# Patient Record
Sex: Female | Born: 1975 | Race: White | Hispanic: Yes | Marital: Single | Smoking: Never smoker
Health system: Southern US, Community
[De-identification: ages and names within clinical notes are randomized; demographics above are authoritative.]

## PROBLEM LIST (undated history)

## (undated) DIAGNOSIS — F32A Depression, unspecified: Secondary | ICD-10-CM

## (undated) DIAGNOSIS — F419 Anxiety disorder, unspecified: Secondary | ICD-10-CM

## (undated) DIAGNOSIS — I10 Essential (primary) hypertension: Secondary | ICD-10-CM

## (undated) DIAGNOSIS — F329 Major depressive disorder, single episode, unspecified: Secondary | ICD-10-CM

## (undated) DIAGNOSIS — T7840XA Allergy, unspecified, initial encounter: Secondary | ICD-10-CM

## (undated) DIAGNOSIS — K589 Irritable bowel syndrome without diarrhea: Secondary | ICD-10-CM

## (undated) DIAGNOSIS — G4733 Obstructive sleep apnea (adult) (pediatric): Secondary | ICD-10-CM

## (undated) HISTORY — DX: Depression, unspecified: F32.A

## (undated) HISTORY — DX: Obstructive sleep apnea (adult) (pediatric): G47.33

## (undated) HISTORY — DX: Irritable bowel syndrome, unspecified: K58.9

## (undated) HISTORY — DX: Allergy, unspecified, initial encounter: T78.40XA

## (undated) HISTORY — DX: Anxiety disorder, unspecified: F41.9

## (undated) HISTORY — DX: Major depressive disorder, single episode, unspecified: F32.9

## (undated) HISTORY — DX: Essential (primary) hypertension: I10

---

## 2007-03-06 ENCOUNTER — Other Ambulatory Visit: Admission: RE | Admit: 2007-03-06 | Discharge: 2007-03-06 | Payer: Self-pay | Admitting: Obstetrics and Gynecology

## 2008-03-06 ENCOUNTER — Emergency Department (HOSPITAL_COMMUNITY): Admission: EM | Admit: 2008-03-06 | Discharge: 2008-03-06 | Payer: Self-pay | Admitting: Emergency Medicine

## 2008-09-22 ENCOUNTER — Encounter: Admission: RE | Admit: 2008-09-22 | Discharge: 2008-09-22 | Payer: Self-pay | Admitting: Internal Medicine

## 2011-03-08 LAB — URINALYSIS, ROUTINE W REFLEX MICROSCOPIC
Bilirubin Urine: NEGATIVE
Glucose, UA: NEGATIVE
Hgb urine dipstick: NEGATIVE
Specific Gravity, Urine: 1.031 — ABNORMAL HIGH
Urobilinogen, UA: 1
pH: 8

## 2011-03-08 LAB — URINE MICROSCOPIC-ADD ON

## 2011-05-05 ENCOUNTER — Ambulatory Visit
Admission: RE | Admit: 2011-05-05 | Discharge: 2011-05-05 | Disposition: A | Discharge status: Home / Self Care | Payer: BC Managed Care – PPO | Source: Ambulatory Visit | Attending: Emergency Medicine | Admitting: Emergency Medicine

## 2011-05-05 ENCOUNTER — Other Ambulatory Visit: Payer: Self-pay | Admitting: Emergency Medicine

## 2011-05-05 MED ORDER — IOHEXOL 300 MG/ML  SOLN
125.0000 mL | Freq: Once | INTRAMUSCULAR | Status: AC | PRN
  Administered 2011-05-05: 125 mL via INTRAVENOUS

## 2011-05-06 ENCOUNTER — Other Ambulatory Visit: Payer: Self-pay | Admitting: Emergency Medicine

## 2011-05-06 DIAGNOSIS — N83209 Unspecified ovarian cyst, unspecified side: Secondary | ICD-10-CM

## 2011-05-09 ENCOUNTER — Ambulatory Visit
Admission: RE | Admit: 2011-05-09 | Discharge: 2011-05-09 | Disposition: A | Discharge status: Home / Self Care | Payer: BC Managed Care – PPO | Source: Ambulatory Visit | Attending: Emergency Medicine | Admitting: Emergency Medicine

## 2011-05-09 DIAGNOSIS — N83209 Unspecified ovarian cyst, unspecified side: Secondary | ICD-10-CM

## 2011-12-13 ENCOUNTER — Ambulatory Visit (INDEPENDENT_AMBULATORY_CARE_PROVIDER_SITE_OTHER): Payer: BC Managed Care – PPO | Admitting: Emergency Medicine

## 2011-12-13 VITALS — BP 128/84 | HR 80 | Temp 98.2°F | Resp 16 | Ht 69.5 in | Wt 226.2 lb

## 2011-12-13 DIAGNOSIS — L509 Urticaria, unspecified: Secondary | ICD-10-CM

## 2011-12-13 MED ORDER — METHYLPREDNISOLONE ACETATE 80 MG/ML IJ SUSP
80.0000 mg | Freq: Once | INTRAMUSCULAR | Status: AC
  Administered 2011-12-13: 80 mg via INTRAMUSCULAR

## 2011-12-13 NOTE — Progress Notes (Signed)
   Date:  12/13/2011   Name:  Judith Francis   DOB:  12/18/1975   MRN:  [id number]  PCP:  No primary provider on file.    Chief Complaint: Rash   History of Present Illness:  Judith Francis is a 36 y.o. very pleasant female patient who presents with the following:  Went to beach and was sunburned over weekend.  Used a number of topical agents that have resulted in hives.  No shortness of breath or wheezing.  Generalized urticarial eruption and itching  There is no problem list on file for this patient.  No past medical history on file. No past surgical history on file. History  Substance Use Topics  . Smoking status: Never Smoker   . Smokeless tobacco: Not on file  . Alcohol Use: No   No family history on file. No Known Allergies  Medication list has been reviewed and updated.  No current outpatient prescriptions on file prior to visit.    Review of Systems:  As per HPI, otherwise negative.    Physical Examination: Filed Vitals:   12/13/11 1545  BP: 128/84  Pulse: 80  Temp: 98.2 F (36.8 C)  Resp: 16   Filed Vitals:   12/13/11 1545  Height: 5' 9.5" (1.765 m)  Weight: 226 lb 3.2 oz (102.604 kg)   Body mass index is 32.93 kg/(m^2). Ideal Body Weight: Weight in (lb) to have BMI = 25: 171.4    GEN: WDWN, NAD, Non-toxic, Alert & Oriented x 3 HEENT: Atraumatic, Normocephalic.  Ears and Nose: No external deformity. EXTR: No clubbing/cyanosis/edema NEURO: Normal gait.  PSYCH: Normally interactive. Conversant. Not depressed or anxious appearing.  Calm demeanor.  Generalized hives  EKG / Labs / Xrays: None available at time of encounter  Assessment and Plan: Urticaria Depo medrol 80  Benadryl Return as needed  Carmelina Dane, MD

## 2011-12-15 ENCOUNTER — Ambulatory Visit (INDEPENDENT_AMBULATORY_CARE_PROVIDER_SITE_OTHER): Payer: BC Managed Care – PPO | Admitting: Family Medicine

## 2011-12-15 VITALS — BP 132/94 | HR 82 | Temp 98.1°F | Resp 18 | Ht 69.5 in | Wt 225.2 lb

## 2011-12-15 DIAGNOSIS — L509 Urticaria, unspecified: Secondary | ICD-10-CM

## 2011-12-15 MED ORDER — DIPHENHYDRAMINE HCL 50 MG/ML IJ SOLN
25.0000 mg | Freq: Once | INTRAMUSCULAR | Status: AC
  Administered 2011-12-15: 25 mg via INTRAMUSCULAR

## 2011-12-15 MED ORDER — PREDNISONE 50 MG PO TABS: ORAL_TABLET | ORAL | Status: AC

## 2011-12-15 MED ORDER — METHYLPREDNISOLONE SODIUM SUCC 1000 MG IJ SOLR
125.0000 mg | Freq: Once | INTRAMUSCULAR | Status: AC
  Administered 2011-12-15: 130 mg via INTRAMUSCULAR

## 2011-12-15 MED ORDER — FAMOTIDINE 20 MG PO TABS: 20.0000 mg | ORAL_TABLET | Freq: Two times a day (BID) | ORAL | Status: DC | PRN

## 2011-12-15 NOTE — Progress Notes (Signed)
  Subjective:    Patient ID: Judith Francis, female    DOB: Aug 15, 1975, 36 y.o.   MRN: [id number]  HPI HPI  This patient complains of a RASH  Location: generalized   Onset: 5-7days ago  Course: Pt went to beach, got mild case of sunburn, had used various lotions and creams to help, developed severe rash and itching after this.  Was seen here and given depomedrol and scheduled benadryl Pt states that itching and rash has persisted despite benadryl.  Pt states that she still has been using creams on daily basis up until today.   Self-treated with: creams, aloe vera  Improvement with treatment: no  History  Itching: yes  Tenderness: no  New medications/antibiotics: no Pet exposure: no  Recent travel or tropical exposure: yes  New soaps, shampoos, detergent, clothing: yes  Tick/insect exposure: no  Red Flags  Feeling ill: no  Fever: no  Facial/tongue swelling/difficulty breathing: no  Diabetic or immunocompromised: no      Review of Systems See HPI, otherwise ROS negative    Objective:   Physical Exam Gen: up in chair, NAD HEENT: NCAT, EOMI, TMs clear bilaterally CV: RRR, no murmurs auscultated PULM: CTAB, no wheezes, rales, rhoncii ABD: S/NT/+ bowel sounds  EXT: 2+ peripheral pulses SKIN:         Assessment & Plan:  Urticaria/Hives: Mildly worsening.  Suspect from chemical/cream exposure.  Discussed avoidance.  Solumedrol 125 and benadryl 25 mg IMx1 Prednisone burst x 5 days. Benadryl and pepcid. Follow up in 2-3 days.  Anaphylactic red flags reviewed.  Handout given.     The patient and/or caregiver has been counseled thoroughly with regard to treatment plan and/or medications prescribed including dosage, schedule, interactions, rationale for use, and possible side effects and they verbalize understanding. Diagnoses and expected course of recovery discussed and will return if not improved as expected or if the condition worsens. Patient and/or  caregiver verbalized understanding.

## 2011-12-15 NOTE — Patient Instructions (Signed)
Ronchas (Hives) Las ronchas (urticaria) son manchas rojas hinchadas en la piel, que pican. Pueden cambiar de tamao, forma, ubicacin y Physiological scientist. Las ronchas que se producen en las capas profundas de la piel pueden causar inflamacin en las manos, los pies y [place]. Las ronchas pueden ser una reaccin alrgica a algo que usted o su hijo hayan comido, tocado o hayan colocado sobre la piel. Las ronchas tambin pueden producirse como reaccin al fro, al calor, a las infecciones virales, a las drogas, a las picaduras de [place], o a las situaciones de Librarian, academic. Es frecuente que no se Oceanographer. Las ronchas pueden durar desde [address] a Psychologist, educational. No es un trastorno contagioso. INSTRUCCIONES PARA EL CUIDADO DOMICILIARIO  Si conoce la causa de las ronchas, evite exponerse a esa fuente.   Para aliviar la picazn y la erupcin:   Aplique compresas fras sobre la piel o tome baos de agua fra. No tome ni haga tomar a su hijo baos o duchas calientes porque el calor puede empeorar la picazn.   El mejor medicamento para las ronchas es el antihistamnico. El antihistamnico no curar las ronchas, Biomedical engineer las reducir. Puede utilizar antihistamnicos de [employer]. Este medicamento podr adormecer a su hijo. Los adolescentes no Doctor, hospital al [employer].   Utilice antihistamnico cada 6 horas o hasta que las ronchas hayan desaparecido completamente durante 24 horas, o segn le hayan indicado.   Podrn prescribirle otros medicamentos a su hijo para Associate Professor. Dle los medicamentos a su hijo Chief Operating Officer que lo asiste.   Usted o su hijo deben usar ropa suelta, inclusive la ropa interior. Las irritaciones de la piel pueden empeorar las ronchas.   Realice el seguimiento segn las instrucciones que le ha dado el profesional que lo asiste.  SOLICITE ATENCIN MDICA SI:  Usted o su hijo an sienten una picazn considerable  despus de [employer] (prescriptos o de Sales promotion account executive).   Existe hinchazn o dolor en las articulaciones.  SOLICITE ATENCIN MDICA DE INMEDIATO SI:  Tiene fiebre.   Nota inflamacin en los labios o en la lengua.   Existe dificultad al respirar o tragar, o siente "falta de espacio" en la garganta o en el pecho.   Siente dolor abdominal (en el vientre).   El nio acta como si estuviera enfermo.  Pueden ser los primeros signos de una reaccin alrgica que ponga en peligro la vida. ESTO ES UNA EMERGENCIA. Pida ayuda mdica al 911. EST SEGURO QUE:   Comprende las instrucciones para el alta mdica.   Controlar su enfermedad.   Solicitar atencin mdica de inmediato segn las indicaciones.  Document Released: 05/23/2005 Document Revised: 05/12/2011 [facility] Patient Information 2012 [place], [place].

## 2012-07-30 ENCOUNTER — Ambulatory Visit (INDEPENDENT_AMBULATORY_CARE_PROVIDER_SITE_OTHER): Payer: BC Managed Care – PPO | Admitting: Emergency Medicine

## 2012-07-30 VITALS — BP 145/86 | HR 71 | Temp 97.9°F | Resp 16 | Ht 69.5 in | Wt 210.0 lb

## 2012-07-30 DIAGNOSIS — G4733 Obstructive sleep apnea (adult) (pediatric): Secondary | ICD-10-CM

## 2012-07-30 MED ORDER — LORAZEPAM 1 MG PO TABS: 1.0000 mg | ORAL_TABLET | Freq: Two times a day (BID) | ORAL | Status: DC | PRN

## 2012-07-30 NOTE — Patient Instructions (Addendum)
Insomnio (Insomnia) El insomnio es un trastorno frecuente en la capacidad para dormirse o para permanecer dormido. Puede ser un problema crnico o un trastorno del momento. En ambos casos es un problema frecuente. Puede tratarse de un problema del momento cuando se relaciona con alguna situacin de estrs o preocupacin. Es un trastorno crnico cuando se relaciona con situaciones de estrs durante las horas de vigilia o con malos hbitos de sueo. Con el tiempo, la privacin del sueo en s misma, puede hacer que el problema empeore. Las cosas ms pequeas se agravan debido al cansancio y a que la capacidad para enfrentarlas disminuye. CAUSAS  Estrs, ansiedad y depresin.  Malos hbitos para dormir.  Distracciones como mirar TV en la cama.  Siestas en horarios prximos a la hora de ir a dormir.  Involucrarse en conversaciones de gran carga emocional antes de ir a dormir.  Leer textos tcnicos antes de dormir.  Consumir alcohol y otros sedantes. Ellos pueden empeorar el problema. Pueden modificar los patrones de sueo normales y la normal actividad onrica.  Consumir estimulantes como cafena algunas horas antes de ir a dormir.  Sndromes dolorosos y las dificultades respiratorias pueden causar insomnio.  Realizar ejercicios a ltima hora de la noche.  El cambio en las zonas horarias puede causar trastornos del sueo (jet lag). En algunos casos se recomienda que otra persona observe sus patrones de sueo. Deben observar los perodos en los que no respira durante la noche (apnea del sueo). Tambin deben observar cunto tiempo duran esos perodos. Si vive slo o no tiene una persona de confianza que lo observe, podr concurrir a una clnica del sueo, en la que lo controlarn de manera profesional. La apnea del sueo requiere controles y tratamiento. Entregue su historia clnica al profesional que lo asiste Tambin infrmele las observaciones que sus familiares hayan hecho con respecto a sus  hbitos de sueo.  SNTOMAS  Sentir por la [place] que no ha descansado lo suficiente.  Ansiedad y agitacin a la hora de dormir  Dificultad para dormirse o para continuar el sueo. TRATAMIENTO  El profesional le indicar un tratamiento para los trastornos subyacentes. Tambin podr aconsejarlo o ayudarlo si usted toma alcohol o se automedica con otras drogas. El tratamiento de los problemas subyacentes generalmente eliminarn los problemas de insomnio.  Podrn prescribirle medicamentos para usar durante un plazo breve. Generalmente no se recomiendan para un uso prolongado.  Generalmente no se recomiendan los medicamentos de venta libre para un uso prolongado. Podran causarle adiccin.  Puede hacer ms fcil el conciliar el sueo si realiza modificaciones en su estilo de vida tales como:  Utilizar tcnicas de relajacin que favorecen la respiracin y reducen la tensin muscular.  No practicar actividad fsica en las ltimas horas del da.  Modificar la dieta y el horario de la ltima comida. No tomar colaciones durante la noche  Trate de establecer una hora habitual para irse a dormir.  La psicoterapia puede ser de utilidad para los problemas que le ocasionan estrs y preocupaciones.  Si hay un ambiente ruidoso y no puede evitarlo, puede ser til escuchar msica suave o sonidos agradables.  Suspenda los trabajos tediosos y detallados al menos una hora antes de ir a dormir. INSTRUCCIONES PARA EL CUIDADO DOMICILIARIO  Lleve un diario. Infrmele al profesional que lo asiste sus progresos. Esto incluye todos los efectos secundarios de los medicamentos. Concurra regularmente a la consulta con el profesional [place] nota de:  La hora en que se duerme.  Las horas en las   que permanece despierto durante la noche.  La calidad del sueo.  Cmo se siente al da siguiente. Esta informacin ayudar a que Biochemist, clinical.   Levntese de la cama si permanece despierto por  ms de 15 minutos. Lea o realice alguna actividad tranquila. Mantenga las luces bajas. Espere hasta que sienta sueo y luego vuelva a la cama.  Mantenga un ritmo constante de vigilia y sueo. Evite las siestas.  Practique actividad fsica con regularidad.  Evite las distracciones en el momento de ir a dormir. Entre las distracciones se Production assistant, radio ver televisin o Education officer, environmental alguna actividad intensa o [place], hacer las cuentas de los gastos domsticos.  Programe un ritual para irse a dormir. Mantenga una rutina familiar relacionada con el bao diario, el cepillado de los [place], irse a la cama todas las noches a la misma hora, Tax adviser [place]. La rutina aumenta el xito de conciliar el sueo ms rpido.  Use tcnicas de relajacin. Practique rutinas que favorezcan la respiracin y alivien la tensin muscular. Tambin puede ayudarlo la visualizacin de escenas pacficas. Tambin puede tratar de [employer] pensamientos problemticos o molestos si mantiene la mente ocupada con pensamientos repetitivos o aburridos, como el antiguo consejo de Multimedia programmer. Tambin puede ser ms creativo, e imaginar que planta hermosas flores en su jardn, una detrs de la otra.  Durante el da, trabaje para Chief Strategy Officer. Cuando no es posible, algunas de las sugerencias ya presentadas lo ayudarn a reducir la ansiedad que acompaa las situaciones de estrs. EST SEGURO QUE:   Comprende las instrucciones para el alta mdica.  Controlar su enfermedad.  Solicitar atencin mdica de inmediato segn las indicaciones. Document Released: 05/23/2005 Document Revised: 08/15/2011 [facility] Patient Information 2013 [place], [place].

## 2012-07-30 NOTE — Progress Notes (Signed)
[facility] [address] [place] [address] [phone]  Date:  07/30/2012   Name:  Judith Francis   DOB:  [birth year]   MRN:  [id number]  PCP:  No primary provider on file.    Chief Complaint: face flushed and Insomnia   History of Present Illness:  Judith Francis is a 37 y.o. very pleasant female patient who presents with the following:  Not able to fall asleep and is fighting with her CPAP machine.  Is becoming frustrated and having emotional difficulties due to the lack of sleep over the past two weeks.  Now has an erythematous change on her cheeks at time like an exaggerated blush when she is emotional.  Is concerned that something is seriously wrong.  There is no problem list on file for this patient.   History reviewed. No pertinent past medical history.  History reviewed. No pertinent past surgical history.  History  Substance Use Topics  . Smoking status: Never Smoker   . Smokeless tobacco: Not on file  . Alcohol Use: No    No family history on file.  No Known Allergies  Medication list has been reviewed and updated.  Current Outpatient Prescriptions on File Prior to Visit  Medication Sig Dispense Refill  . DIPHENHYDRAMINE HCL, TOPICAL, (BENADRYL ITCH STOPPING) 2 % GEL Apply topically as needed.      . famotidine (PEPCID) 20 MG tablet Take 1 tablet (20 mg total) by mouth 2 (two) times daily as needed for heartburn.  30 tablet  0  . Naproxen Sodium (ALEVE PO) Take by mouth as needed.       No current facility-administered medications on file prior to visit.    Review of Systems:  As per HPI, otherwise negative.    Physical Examination: Filed Vitals:   07/30/12 1332  BP: 145/86  Pulse: 71  Temp: 97.9 F (36.6 C)  Resp: 16   Filed Vitals:   07/30/12 1332  Height: 5' 9.5" (1.765 m)  Weight: 210 lb (95.255 kg)   Body mass index is 30.58 kg/(m^2). Ideal Body Weight: Weight in (lb) to have BMI = 25:  171.4  GEN: WDWN, NAD, Non-toxic, A & O x 3 HEENT: Atraumatic, Normocephalic. Neck supple. No masses, No LAD.  Intermittent erythema of cheeks.  No rash Ears and Nose: No external deformity. CV: RRR, No M/G/R. No JVD. No thrill. No extra heart sounds. PULM: CTA B, no wheezes, crackles, rhonchi. No retractions. No resp. distress. No accessory muscle use. ABD: S, NT, ND, +BS. No rebound. No HSM. EXTR: No c/c/e NEURO Normal gait.  PSYCH: Normally interactive. Conversant. Not depressed or anxious appearing.  Calm demeanor.    Assessment and Plan:  OSA Insomnia depression  Carmelina Dane, MD

## 2012-08-24 ENCOUNTER — Ambulatory Visit (INDEPENDENT_AMBULATORY_CARE_PROVIDER_SITE_OTHER): Payer: BC Managed Care – PPO | Admitting: Emergency Medicine

## 2012-08-24 ENCOUNTER — Encounter: Payer: Self-pay | Admitting: Emergency Medicine

## 2012-08-24 ENCOUNTER — Other Ambulatory Visit: Payer: Self-pay

## 2012-08-24 ENCOUNTER — Other Ambulatory Visit: Payer: Self-pay | Admitting: Emergency Medicine

## 2012-08-24 VITALS — BP 135/88 | HR 64 | Temp 97.5°F | Resp 16 | Ht 70.0 in | Wt 208.0 lb

## 2012-08-24 DIAGNOSIS — Z0000 Encounter for general adult medical examination without abnormal findings: Secondary | ICD-10-CM

## 2012-08-24 DIAGNOSIS — G4733 Obstructive sleep apnea (adult) (pediatric): Secondary | ICD-10-CM

## 2012-08-24 DIAGNOSIS — E663 Overweight: Secondary | ICD-10-CM

## 2012-08-24 DIAGNOSIS — R109 Unspecified abdominal pain: Secondary | ICD-10-CM

## 2012-08-24 DIAGNOSIS — R21 Rash and other nonspecific skin eruption: Secondary | ICD-10-CM

## 2012-08-24 DIAGNOSIS — N63 Unspecified lump in unspecified breast: Secondary | ICD-10-CM

## 2012-08-24 LAB — COMPREHENSIVE METABOLIC PANEL
AST: 26 U/L (ref 0–37)
Albumin: 4.1 g/dL (ref 3.5–5.2)
Alkaline Phosphatase: 79 U/L (ref 39–117)
Calcium: 8.8 mg/dL (ref 8.4–10.5)
Chloride: 105 mEq/L (ref 96–112)
Glucose, Bld: 87 mg/dL (ref 70–99)
Potassium: 4.1 mEq/L (ref 3.5–5.3)
Sodium: 137 mEq/L (ref 135–145)
Total Protein: 6.8 g/dL (ref 6.0–8.3)

## 2012-08-24 LAB — POCT UA - MICROSCOPIC ONLY
Bacteria, U Microscopic: NEGATIVE
Yeast, UA: NEGATIVE

## 2012-08-24 LAB — POCT URINALYSIS DIPSTICK
Bilirubin, UA: NEGATIVE
Glucose, UA: NEGATIVE
Ketones, UA: NEGATIVE
Leukocytes, UA: NEGATIVE

## 2012-08-24 LAB — CBC WITH DIFFERENTIAL/PLATELET
Basophils Relative: 0 % (ref 0–1)
Eosinophils Absolute: 0.1 10*3/uL (ref 0.0–0.7)
MCH: 32.7 pg (ref 26.0–34.0)
MCHC: 34.2 g/dL (ref 30.0–36.0)
Neutrophils Relative %: 66 % (ref 43–77)
Platelets: 283 10*3/uL (ref 150–400)
RBC: 4.16 MIL/uL (ref 3.87–5.11)

## 2012-08-24 LAB — TSH: TSH: 1.631 u[IU]/mL (ref 0.350–4.500)

## 2012-08-24 MED ORDER — LORAZEPAM 1 MG PO TABS: 1.0000 mg | ORAL_TABLET | Freq: Two times a day (BID) | ORAL | Status: DC | PRN

## 2012-08-24 NOTE — Progress Notes (Deleted)
  Subjective:    Patient ID: Judith Francis, female    DOB: 01/25/1976, 37 y.o.   MRN: [id number]  HPI    Review of Systems     Objective:   Physical Exam        Assessment & Plan:

## 2012-08-24 NOTE — Progress Notes (Signed)
  Subjective:    Patient ID: Judith Francis, female    DOB: 29-Jun-1975, 37 y.o.   MRN: [id number]  HPI pt presents with CPE and GYN visit today. She states that she was having anxiety issues with blushing and having a hard time going to sleep at night. She has a cpap. She recently was seen for sleep issues and anxiety and was prescribed Ativan. She says this helps her to sleep at night and it has also helped with the blushing.     Review of Systems  Respiratory: Positive for apnea.   Psychiatric/Behavioral: Positive for sleep disturbance. The patient is nervous/anxious.        Objective:   Physical Exam HEENT exam is unremarkable. Neck is supple. Chest is clear to both auscultation and percussion. Breast exam reveals no definite masses. There is a questionable firm area present over the right breast at 12:00 approximately 3 inches from the nipple. Abdomen is soft nontender. Cardiac exam is regular rate and rhythm without murmurs rubs or gallops. Pelvic exam reveals a normal cervix with mild erosions there are no adnexal masses uterus is not enlarged. Extremity exam reveals no cyanosis clubbing or edema.        Assessment & Plan:  Routine labs were done today. She is to continue her CPAP machine for obstructive sleep apnea. Will continue Ativan at night to help her rest.. Mammogram will be ordered

## 2012-08-27 LAB — GLIA (IGA/G) + TTG IGA
Gliadin IgA: 4.2 U/mL (ref <20)
Gliadin IgG: 7.8 U/mL (ref <20)

## 2012-08-27 LAB — PAP IG, CT-NG, RFX HPV ASCU

## 2012-09-17 ENCOUNTER — Ambulatory Visit: Payer: BC Managed Care – PPO | Admitting: *Deleted

## 2012-09-24 ENCOUNTER — Encounter (HOSPITAL_BASED_OUTPATIENT_CLINIC_OR_DEPARTMENT_OTHER): Payer: Self-pay | Admitting: *Deleted

## 2012-10-01 ENCOUNTER — Ambulatory Visit: Payer: BC Managed Care – PPO | Admitting: *Deleted

## 2012-10-05 ENCOUNTER — Encounter: Payer: Self-pay | Admitting: *Deleted

## 2012-10-05 ENCOUNTER — Encounter: Payer: BC Managed Care – PPO | Attending: Emergency Medicine | Admitting: *Deleted

## 2012-10-05 DIAGNOSIS — Z713 Dietary counseling and surveillance: Secondary | ICD-10-CM | POA: Insufficient documentation

## 2012-10-05 DIAGNOSIS — E669 Obesity, unspecified: Secondary | ICD-10-CM | POA: Insufficient documentation

## 2012-10-05 NOTE — Progress Notes (Signed)
Medical Nutrition Therapy:  Appt start time: 0915 end time:  1015.  Assessment:  Primary concern today: Obesity. Patient reports that she is interested in losing weight. She has no history of dieting. She has retained about 10-20 pounds from each of her 2 pregnancies and has gained an additional 30 pounds over the last 2 years. She suffers from sleep apnea and insomnia. She also reports overeating from anxiety and stress. Her intake is inconsistent, sometimes not eating much until dinner. She eats out frequently. She had been going to karate classes MWF, but has not gone in the last 2 months.   WEIGHT: 212.1 pounds  MEDICATIONS: Reviewed   DIETARY INTAKE:   Usual eating pattern includes 3 meals and 2 snacks per day.  24-hr recall:  B ( AM): Sometimes, eggs, fruit, chicken, cookies  Snk ( AM): Vegetable, fruit, cookie, cake  L ( PM): Fast food (chicken, salad, fruit, yogurt) Snk ( PM): Same D ( PM): Taco Bell, McDonald's, White rice, chicken, beans Snk ( PM): None Beverages: Water, juice  Usual physical activity: Karate/self-defense MWF  Estimated energy needs: 1500 calories 188 g carbohydrates 94 g protein 42 g fat  Progress Towards Goal(s):  In progress.   Nutritional Diagnosis:  [place]-3.3 Overweight/obesity As related to excessive energy intake.  As evidenced by BMI 30.5.    Intervention:  Nutrition counsling. We discussed strategies for weight loss, including balancing nutrients (carbs, protein, fat), portion control, healthy snacks, and exercise.   Goals:  1. 1 pound weight loss per week. Goal weight 208 pounds by next visit.  2. Monitor portion size of foods. Limit starches (rice, beans, corn) to 2 servings at meals.  3. Increase vegetables to 3 servings daily.  4. Eat 3 regular meals per day and up to 2 planned, healthy snacks.  5. Continue to exercise at least 30 minutes 3 days a week. Add in other cardio exercises (walking) 1-2 days weekly.   Handouts given during visit  include:  Weight loss tips  Yellow portion card  Monitoring/Evaluation:  Dietary intake, exercise, and body weight in 1 month(s).

## 2012-10-22 ENCOUNTER — Ambulatory Visit (INDEPENDENT_AMBULATORY_CARE_PROVIDER_SITE_OTHER): Payer: BC Managed Care – PPO | Admitting: Family Medicine

## 2012-10-22 VITALS — BP 132/74 | HR 64 | Temp 98.4°F | Resp 16 | Ht 70.0 in | Wt 212.0 lb

## 2012-10-22 DIAGNOSIS — R079 Chest pain, unspecified: Secondary | ICD-10-CM

## 2012-10-22 DIAGNOSIS — E669 Obesity, unspecified: Secondary | ICD-10-CM | POA: Insufficient documentation

## 2012-10-22 DIAGNOSIS — G4700 Insomnia, unspecified: Secondary | ICD-10-CM

## 2012-10-22 DIAGNOSIS — E663 Overweight: Secondary | ICD-10-CM

## 2012-10-22 MED ORDER — TRAZODONE HCL 100 MG PO TABS: 100.0000 mg | ORAL_TABLET | Freq: Every day | ORAL | Status: DC

## 2012-10-22 NOTE — Progress Notes (Signed)
  Subjective:    Patient ID: Judith Francis, female    DOB: [birth year], 37 y.o.   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   Slight nausea at times, no fever, no cough, no lower extremity pain   Notes some fullness in chest  Sleep apnea- doesn't always use her machine because it interrupts her sleep No asthma  Does not take Prilosec/ or GERD medication recently taking Ativan to help her sleep.    Divorced mother of 2 children(2nd and 7th grader), and cares for niece who lives with her and goes to [employer].  Review of Systems No fever    Objective:   Physical Exam Alert, NAD Chest:  Clear Heart:  No murmur, gallop or murmur Skin: clear Back:  Normal contour.    Assessment & Plan:  I suspect this is related to anxiety.  Her situation is better than a year ago, so this is hard to understand.  Plan:  Start Trazodone 75 qhs and recheck in 3 weeks.  Signed, Elvina Sidle, MD

## 2012-10-22 NOTE — Patient Instructions (Addendum)
Results for orders placed in visit on 08/24/12  COMPREHENSIVE METABOLIC PANEL      Result Value Range   Sodium 137  135 - 145 mEq/L   Potassium 4.1  3.5 - 5.3 mEq/L   Chloride 105  96 - 112 mEq/L   CO2 26  19 - 32 mEq/L   Glucose, Bld 87  70 - 99 mg/dL   BUN 10  6 - 23 mg/dL   Creat 4.69  6.29 - 5.28 mg/dL   Total Bilirubin 0.5  0.3 - 1.2 mg/dL   Alkaline Phosphatase 79  39 - 117 U/L   AST 26  0 - 37 U/L   ALT 43 (*) 0 - 35 U/L   Total Protein 6.8  6.0 - 8.3 g/dL   Albumin 4.1  3.5 - 5.2 g/dL   Calcium 8.8  8.4 - 41.3 mg/dL  CBC WITH DIFFERENTIAL      Result Value Range   WBC 5.5  4.0 - 10.5 K/uL   RBC 4.16  3.87 - 5.11 MIL/uL   Hemoglobin 13.6  12.0 - 15.0 g/dL   HCT 24.4  01.0 - 27.2 %   MCV 95.7  78.0 - 100.0 fL   MCH 32.7  26.0 - 34.0 pg   MCHC 34.2  30.0 - 36.0 g/dL   RDW 53.6  64.4 - 03.4 %   Platelets 283  150 - 400 K/uL   Neutrophils Relative % 66  43 - 77 %   Neutro Abs 3.6  1.7 - 7.7 K/uL   Lymphocytes Relative 26  12 - 46 %   Lymphs Abs 1.5  0.7 - 4.0 K/uL   Monocytes Relative 7  3 - 12 %   Monocytes Absolute 0.4  0.1 - 1.0 K/uL   Eosinophils Relative 1  0 - 5 %   Eosinophils Absolute 0.1  0.0 - 0.7 K/uL   Basophils Relative 0  0 - 1 %   Basophils Absolute 0.0  0.0 - 0.1 K/uL   Smear Review Criteria for review not met    TSH      Result Value Range   TSH 1.631  0.350 - 4.500 uIU/mL  LIPID PANEL      Result Value Range   Cholesterol 167  0 - 200 mg/dL   Triglycerides 742  <595 mg/dL   HDL 44  >63 mg/dL   Total CHOL/HDL Ratio 3.8     VLDL 27  0 - 40 mg/dL   LDL Cholesterol 96  0 - 99 mg/dL  ANA      Result Value Range   ANA NEG  NEGATIVE  CELIAC PANEL      Result Value Range   Tissue Transglutaminase Ab, IgA 4.4  <20 U/mL   Gliadin IgG 7.8  <20 U/mL   Gliadin IgA 4.2  <20 U/mL  POCT URINALYSIS DIPSTICK      Result Value Range   Color, UA yellow     Clarity, UA clear     Glucose, UA neg     Bilirubin, UA neg     Ketones, UA neg     Spec Grav,  UA 1.020     Blood, UA neg     pH, UA 7.0     Protein, UA trace     Urobilinogen, UA 1.0     Nitrite, UA neg     Leukocytes, UA Negative    POCT UA - MICROSCOPIC ONLY      Result Value Range  WBC, Ur, HPF, POC 0-3     RBC, urine, microscopic 0-3     Bacteria, U Microscopic neg     Mucus, UA trace     Epithelial cells, urine per micros 0-7     Crystals, Ur, HPF, POC neg     Casts, Ur, LPF, POC neg     Yeast, UA neg    PAP IG, CT-NG, RFX HPV ASCU      Result Value Range   Specimen adequacy:       FINAL DIAGNOSIS:       Cytotechnologist:       Chlamydia Probe Amp NEGATIVE     GC Probe Amp NEGATIVE

## 2012-11-05 ENCOUNTER — Ambulatory Visit: Payer: BC Managed Care – PPO | Admitting: *Deleted

## 2012-12-04 ENCOUNTER — Ambulatory Visit: Payer: BC Managed Care – PPO

## 2012-12-04 ENCOUNTER — Encounter: Payer: Self-pay | Admitting: Emergency Medicine

## 2012-12-04 ENCOUNTER — Ambulatory Visit (INDEPENDENT_AMBULATORY_CARE_PROVIDER_SITE_OTHER): Payer: BC Managed Care – PPO | Admitting: Emergency Medicine

## 2012-12-04 VITALS — BP 110/76 | HR 74 | Temp 98.3°F | Resp 16 | Ht 69.5 in | Wt 218.0 lb

## 2012-12-04 DIAGNOSIS — R079 Chest pain, unspecified: Secondary | ICD-10-CM

## 2012-12-04 DIAGNOSIS — G4733 Obstructive sleep apnea (adult) (pediatric): Secondary | ICD-10-CM

## 2012-12-04 DIAGNOSIS — N63 Unspecified lump in unspecified breast: Secondary | ICD-10-CM

## 2012-12-04 NOTE — Progress Notes (Signed)
  Subjective:    Patient ID: Judith Francis, female    DOB: [birth year], 37 y.o.   MRN: [id number]  HPI patient here for followup. Of note she did not have her right breast ultrasound performed. She has not been able to tolerate her CPAP. She has been having daily left-sided chest pain with pain around the left breast and pain into the left arm. This is not exertional related and occurs on a daily basis. She states there is no possibility she is pregnant    Review of Systems     Objective:   Physical Exam patient is alert and cooperative she is in no distress. Her neck is supple. Chest is clear. Cardiac exam is regular rate without murmurs. Breast exam reveals a pea size area present at 9:00 approximately 1 inch from the nipple on the right breast the left breast is without mass. There is mild tenderness over the costal chondral junction of the left sixth and seventh ribs.  [facility] reading (PRIMARY) by  Dr. Cleta Alberts Chest x-ray is no acute disease  EKG is normal sinus rhythm       Assessment & Plan:   Referral is being made to cardiology for evaluation of chest pain. I believe this to be musculoskeletal in origin because it has been present for 3-4 months with no changes on her EKG. I have also rescheduled her for an ultrasound to be done on both breasts since now she has bilateral breast pain but has a abnormal feeling area adjacent to the right nipple which needs evaluation .

## 2013-02-07 ENCOUNTER — Ambulatory Visit (INDEPENDENT_AMBULATORY_CARE_PROVIDER_SITE_OTHER): Payer: BC Managed Care – PPO | Admitting: Cardiology

## 2013-02-07 ENCOUNTER — Encounter: Payer: Self-pay | Admitting: Cardiology

## 2013-02-07 VITALS — BP 146/90 | HR 66 | Wt 220.0 lb

## 2013-02-07 DIAGNOSIS — I10 Essential (primary) hypertension: Secondary | ICD-10-CM | POA: Insufficient documentation

## 2013-02-07 DIAGNOSIS — R079 Chest pain, unspecified: Secondary | ICD-10-CM | POA: Insufficient documentation

## 2013-02-07 DIAGNOSIS — G4733 Obstructive sleep apnea (adult) (pediatric): Secondary | ICD-10-CM

## 2013-02-07 NOTE — Assessment & Plan Note (Signed)
Symptoms are extremely atypical and I do not think consistent with cardiac pain. Electrocardiogram is normal. Her pain has been continuous for one year without completely resolving. I do not think further cardiac workup is indicated. Possible musculoskeletal etiology.

## 2013-02-07 NOTE — Patient Instructions (Signed)
Your physician recommends that you schedule a follow-up appointment in: AS NEEDED  

## 2013-02-07 NOTE — Progress Notes (Signed)
  HPI: 37 year old female for evaluation of chest pain. Chest x-ray in July of 2014 unremarkable. Patient has had chest pain for approximately one year by her report. It can occur in the right breast area, left breast area, substernal area and it radiates to her neck, back and shoulder. It has been continuous for one year without completely resolving. Pain is not pleuritic, positional or related to food. Some improvement with Advil. The pain does not change with exertion and is not related to food. She does not have significant dyspnea on exertion, orthopnea, PND or syncope. Because of the above we were asked to evaluate.  Current Outpatient Prescriptions  Medication Sig Dispense Refill  . Naproxen Sodium (ALEVE PO) Take by mouth as needed.       No current facility-administered medications for this visit.    Allergies  Allergen Reactions  . Trazodone And Nefazodone Other (See Comments)    Per patient made sleep apnea worse    Past Medical History  Diagnosis Date  . Allergy   . Depression   . Anxiety   . Hypertension   . OSA (obstructive sleep apnea)   . IBS (irritable bowel syndrome)     History reviewed. No pertinent past surgical history.  History   Social History  . Marital Status: Single    Spouse Name: N/A    Number of Children: 2  . Years of Education: N/A   Occupational History  . interpreter [employer]   Social History Main Topics  . Smoking status: Never Smoker   . Smokeless tobacco: Not on file  . Alcohol Use: No  . Drug Use: No  . Sexual Activity: No   Other Topics Concern  . Not on file   Social History Narrative   Divorced; 2 children    Family History  Problem Relation Age of Onset  . Diabetes Mother   . Migraines Mother   . Hypertension Father   . Kidney Stones Sister   . Migraines Sister   . Gallbladder disease Sister   . Pancreatitis Brother   . Diabetes Maternal Grandmother   . Cancer Maternal Grandmother   . Cancer  Maternal Grandfather   . Cancer Paternal Grandmother   . Cancer Paternal Grandfather     ROS: occasional shocklike sensation in left upper extremity but no fevers or chills, productive cough, hemoptysis, dysphasia, odynophagia, melena, hematochezia, dysuria, hematuria, rash, seizure activity, orthopnea, PND, pedal edema, claudication. Remaining systems are negative.  Physical Exam:   Blood pressure 146/90, pulse 66, weight 220 lb (99.791 kg).  General:  Well developed/well nourished in NAD Skin warm/dry Patient not depressed No peripheral clubbing Back-normal HEENT-normal/normal eyelids Neck supple/normal carotid upstroke bilaterally; no bruits; no JVD; no thyromegaly chest - CTA/ normal expansion CV - RRR/normal S1 and S2; no murmurs, rubs or gallops;  PMI nondisplaced Abdomen -NT/ND, no HSM, no mass, + bowel sounds, no bruit 2+ femoral pulses, no bruits Ext-no edema, chords, 2+ DP Neuro-grossly nonfocal  ECG 12/04/2012-sinus rhythm no ST changes.  Electrocardiogram today shows sinus rhythm at a rate of 60. No ST change.

## 2013-02-07 NOTE — Assessment & Plan Note (Signed)
Continue CPAP.  

## 2013-02-07 NOTE — Assessment & Plan Note (Signed)
Blood pressure borderline. She will follow up with primary care for this issue.

## 2013-04-23 ENCOUNTER — Ambulatory Visit (INDEPENDENT_AMBULATORY_CARE_PROVIDER_SITE_OTHER): Payer: BC Managed Care – PPO | Admitting: Family Medicine

## 2013-04-23 VITALS — BP 116/74 | HR 80 | Temp 98.4°F | Resp 18 | Ht 69.5 in | Wt 227.0 lb

## 2013-04-23 DIAGNOSIS — B029 Zoster without complications: Secondary | ICD-10-CM

## 2013-04-23 DIAGNOSIS — M542 Cervicalgia: Secondary | ICD-10-CM

## 2013-04-23 MED ORDER — VALACYCLOVIR HCL 1 G PO TABS: 1000.0000 mg | ORAL_TABLET | Freq: Three times a day (TID) | ORAL | Status: DC

## 2013-04-23 NOTE — Progress Notes (Signed)
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  She c/o 36 hours of burning dysesthesia on left neck into shoulder without significant rash.  Objective:  NAD Neck exam:  Normal inspection, palpation, ROM  Shingles - Plan: valACYclovir (VALTREX) 1000 MG tablet    Signed, Elvina Sidle, MD

## 2013-04-23 NOTE — Patient Instructions (Signed)
Shingles Shingles (herpes zoster) is an infection that is caused by the same virus that causes chickenpox (varicella). The infection causes a painful skin rash and fluid-filled blisters, which eventually break open, crust over, and heal. It may occur in any area of the body, but it usually affects only one side of the body or face. The pain of shingles usually lasts about 1 month. However, some people with shingles may develop long-term (chronic) pain in the affected area of the body. Shingles often occurs many years after the person had chickenpox. It is more common:  In people older than 50 years.  In people with weakened immune systems, such as those with HIV, AIDS, or cancer.  In people taking medicines that weaken the immune system, such as transplant medicines.  In people under great stress. CAUSES  Shingles is caused by the varicella zoster virus (VZV), which also causes chickenpox. After a person is infected with the virus, it can remain in the person's body for years in an inactive state (dormant). To cause shingles, the virus reactivates and breaks out as an infection in a nerve root. The virus can be spread from person to person (contagious) through contact with open blisters of the shingles rash. It will only spread to people who have not had chickenpox. When these people are exposed to the virus, they may develop chickenpox. They will not develop shingles. Once the blisters scab over, the person is no longer contagious and cannot spread the virus to others. SYMPTOMS  Shingles shows up in stages. The initial symptoms may be pain, itching, and tingling in an area of the skin. This pain is usually described as burning, stabbing, or throbbing.In a few days or weeks, a painful red rash will appear in the area where the pain, itching, and tingling were felt. The rash is usually on one side of the body in a band or belt-like pattern. Then, the rash usually turns into fluid-filled blisters. They  will scab over and dry up in approximately 2 3 weeks. Flu-like symptoms may also occur with the initial symptoms, the rash, or the blisters. These may include:  Fever.  Chills.  Headache.  Upset stomach. DIAGNOSIS  Your caregiver will perform a skin exam to diagnose shingles. Skin scrapings or fluid samples may also be taken from the blisters. This sample will be examined under a microscope or sent to a lab for further testing. TREATMENT  There is no specific cure for shingles. Your caregiver will likely prescribe medicines to help you manage the pain, recover faster, and avoid long-term problems. This may include antiviral drugs, anti-inflammatory drugs, and pain medicines. HOME CARE INSTRUCTIONS   Take a cool bath or apply cool compresses to the area of the rash or blisters as directed. This may help with the pain and itching.   Only take over-the-counter or prescription medicines as directed by your caregiver.   Rest as directed by your caregiver.  Keep your rash and blisters clean with mild soap and cool water or as directed by your caregiver.  Do not pick your blisters or scratch your rash. Apply an anti-itch cream or numbing creams to the affected area as directed by your caregiver.  Keep your shingles rash covered with a loose bandage (dressing).  Avoid skin contact with:  Babies.   Pregnant women.   Children with eczema.   Elderly people with transplants.   People with chronic illnesses, such as leukemia or AIDS.   Wear loose-fitting clothing to help ease   the pain of material rubbing against the rash.  Keep all follow-up appointments with your caregiver.If the area involved is on your face, you may receive a referral for follow-up to a specialist, such as an eye doctor (ophthalmologist) or an ear, nose, and throat (ENT) doctor. Keeping all follow-up appointments will help you avoid eye complications, chronic pain, or disability.  SEEK IMMEDIATE MEDICAL  CARE IF:   You have facial pain, pain around the eye area, or loss of feeling on one side of your face.  You have ear pain or ringing in your ear.  You have loss of taste.  Your pain is not relieved with prescribed medicines.   Your redness or swelling spreads.   You have more pain and swelling.  Your condition is worsening or has changed.   You have a feveror persistent symptoms for more than 2 3 days.  You have a fever and your symptoms suddenly get worse. MAKE SURE YOU:  Understand these instructions.  Will watch your condition.  Will get help right away if you are not doing well or get worse. Document Released: 05/23/2005 Document Revised: 02/15/2012 Document Reviewed: 01/05/2012 Exit[employer] Patient Information 2014 [employer].  

## 2013-05-23 ENCOUNTER — Ambulatory Visit (INDEPENDENT_AMBULATORY_CARE_PROVIDER_SITE_OTHER): Payer: BC Managed Care – PPO | Admitting: Emergency Medicine

## 2013-05-23 VITALS — BP 120/76 | HR 76 | Temp 98.1°F | Resp 18 | Ht 69.5 in | Wt 224.0 lb

## 2013-05-23 DIAGNOSIS — J811 Chronic pulmonary edema: Secondary | ICD-10-CM

## 2013-05-23 DIAGNOSIS — R05 Cough: Secondary | ICD-10-CM

## 2013-05-23 DIAGNOSIS — J029 Acute pharyngitis, unspecified: Secondary | ICD-10-CM

## 2013-05-23 LAB — POCT INFLUENZA A/B
Influenza A, POC: NEGATIVE
Influenza B, POC: NEGATIVE

## 2013-05-23 LAB — POCT RAPID STREP A (OFFICE): Rapid Strep A Screen: NEGATIVE

## 2013-05-23 MED ORDER — BENZONATATE 100 MG PO CAPS
100.0000 mg | ORAL_CAPSULE | Freq: Three times a day (TID) | ORAL | Status: DC | PRN
Start: 2013-05-23 — End: 2014-04-28

## 2013-05-23 MED ORDER — FIRST-DUKES MOUTHWASH MT SUSP: OROMUCOSAL | Status: DC

## 2013-05-23 NOTE — Patient Instructions (Signed)
Sore Throat A sore throat is pain, burning, irritation, or scratchiness of the throat. There is often pain or tenderness when swallowing or talking. A sore throat may be accompanied by other symptoms, such as coughing, sneezing, fever, and swollen neck glands. A sore throat is often the first sign of another sickness, such as a cold, flu, strep throat, or mononucleosis (commonly known as mono). Most sore throats go away without medical treatment. CAUSES  The most common causes of a sore throat include:  A viral infection, such as a cold, flu, or mono.  A bacterial infection, such as strep throat, tonsillitis, or whooping cough.  Seasonal allergies.  Dryness in the air.  Irritants, such as smoke or pollution.  Gastroesophageal reflux disease (GERD). HOME CARE INSTRUCTIONS   Only take over-the-counter medicines as directed by your caregiver.  Drink enough fluids to keep your urine clear or pale yellow.  Rest as needed.  Try using throat sprays, lozenges, or sucking on hard candy to ease any pain (if older than 4 years or as directed).  Sip warm liquids, such as broth, herbal tea, or warm water with honey to relieve pain temporarily. You may also eat or drink cold or frozen liquids such as frozen ice pops.  Gargle with salt water (mix 1 tsp salt with 8 oz of water).  Do not smoke and avoid secondhand smoke.  Put a cool-mist humidifier in your bedroom at night to moisten the air. You can also turn on a hot shower and sit in the bathroom with the door closed for 5 10 minutes. SEEK IMMEDIATE MEDICAL CARE IF:  You have difficulty breathing.  You are unable to swallow fluids, soft foods, or your saliva.  You have increased swelling in the throat.  Your sore throat does not get better in 7 days.  You have nausea and vomiting.  You have a fever or persistent symptoms for more than 2 3 days.  You have a fever and your symptoms suddenly get worse. MAKE SURE YOU:   Understand  these instructions.  Will watch your condition.  Will get help right away if you are not doing well or get worse. Document Released: 06/30/2004 Document Revised: 05/09/2012 Document Reviewed: 01/29/2012 Exit[employer] Patient Information 2014 [employer].  

## 2013-05-23 NOTE — Progress Notes (Addendum)
Subjective:  This chart was scribed for Judith Chris, MD by Carl Best, Medical Scribe. This patient was seen in Room 13 and the patient's care was started at 9:35 AM.   Patient ID: Judith Francis, female    DOB: 26-Sep-1975, 37 y.o.   MRN: [id number]  HPI HPI Comments: Judith Francis is a 37 y.o. female who presents to the Emergency Department complaining of constant sore throat, nasal congestion, and cough that started four days ago.  She states that her symptoms started with headache and fatigue.  She states that on Tuesday she experienced a lot of clear nasal drainage and a lot of sneezing.  She states that last night she had a sore throat.  She states that today her voice as changed.  She denies fever as an associated symptom.  She denies having a history of allergies this time of year.  She states that she is working with a lot of sick people.  She denies being around someone with strep throat.  She states that she did not have a flu shot this year.    Past Medical History  Diagnosis Date  . Allergy   . Depression   . Anxiety   . Hypertension   . OSA (obstructive sleep apnea)   . IBS (irritable bowel syndrome)    History reviewed. No pertinent past surgical history. Family History  Problem Relation Age of Onset  . Diabetes Mother   . Migraines Mother   . Hypertension Father   . Kidney Stones Sister   . Migraines Sister   . Gallbladder disease Sister   . Pancreatitis Brother   . Diabetes Maternal Grandmother   . Cancer Maternal Grandmother   . Cancer Maternal Grandfather   . Cancer Paternal Grandmother   . Cancer Paternal Grandfather    History   Social History  . Marital Status: Single    Spouse Name: N/A    Number of Children: 2  . 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 Smoking status: Never Smoker   . Smokeless tobacco: Not on file  . Alcohol Use: No  . Drug Use: No   . Sexual Activity: No   Other Topics Concern  . Not on file   Social History Narrative   Divorced; 2 children   Allergies  Allergen Reactions  . Trazodone And Nefazodone Other (See Comments)    Per patient made sleep apnea worse    Review of Systems  Constitutional: Negative for fever.  HENT: Positive for congestion, postnasal drip, sore throat and voice change.   Respiratory: Positive for cough.   All other systems reviewed and are negative.     Objective:  Physical Exam Physical Exam  Nursing note and vitals reviewed. Constitutional: She is oriented to person, place, and time. She appears well-developed and well-nourished. No distress.  HENT there is redness of the posterior pharynx. Patient has had a tonsillectomy. TMs nose are normal:  Head: Normocephalic and atraumatic.  Right Ear: External ear normal.  Left Ear: External ear normal.  Eyes: Conjunctivae and EOM are normal. Pupils are equal, round, and reactive to light.  Neck: Neck supple.  Cardiovascular: Normal rate.   Pulmonary/Chest: Effort normal.  Musculoskeletal: Normal range of motion.  Neurological: She is alert and oriented to person, place, and time. No cranial nerve deficit.  Psychiatric: She has a normal mood and affect. Her behavior is normal.  Results for orders placed in visit on 05/23/13  POCT INFLUENZA A/B      Result Value Range   Influenza A, POC Negative     Influenza B, POC Negative       Strep neg   Assessment & Plan:  Patient has a viral type illness. We'll treat symptomatically at present. I personally performed the services described in this documentation, which was scribed in my presence. The recorded information has been reviewed and is accurate.

## 2013-05-25 LAB — CULTURE, GROUP A STREP: Organism ID, Bacteria: NORMAL

## 2014-04-09 ENCOUNTER — Ambulatory Visit (INDEPENDENT_AMBULATORY_CARE_PROVIDER_SITE_OTHER): Payer: BC Managed Care – PPO | Admitting: Emergency Medicine

## 2014-04-09 VITALS — BP 126/84 | HR 60 | Temp 98.4°F | Resp 18 | Ht 69.75 in | Wt 226.8 lb

## 2014-04-09 DIAGNOSIS — G4730 Sleep apnea, unspecified: Secondary | ICD-10-CM

## 2014-04-09 DIAGNOSIS — N644 Mastodynia: Secondary | ICD-10-CM

## 2014-04-09 DIAGNOSIS — Z0000 Encounter for general adult medical examination without abnormal findings: Secondary | ICD-10-CM

## 2014-04-09 NOTE — Progress Notes (Signed)
Subjective:  This chart was scribed for Judith GobbleSteven A Daeveon Zweber, MD by Charline BillsEssence Howell, ED Scribe. The patient was seen in room 1. Patient's care was started at 11:17 AM.   Patient ID: Judith Francis, female    DOB: 02/03/1976, 38 y.o.   MRN: [id number]  Chief Complaint  Patient presents with  . Referral    regarding sleep apnea   HPI HPI Comments: Judith Francis is a 38 y.o. female, with a h/o OSA, HTN, depression and anxiety, who presents to the [facility] for referral regarding sleep apnea. Pt has [facility] years and now needs a referral in order to be seen. She states that she has been using her cpap machine that is set to 12 but she needs a new mask. Pt states "if I don't use my cpap, I feel like I'm going to die".    Preventative Maintenance  Pt also requests a referral to GYN during this visit. She states that she wants to have a mammogram done due to constant, unchanged breast pain that radiates into back and lumps that she has discovered. She denies family h/o breast CA. Pt reports that her menstrual periods are normal. Pt is not currently taking BC; she denies current sexual activity. Last pap smear was approximately 5 years ago.   Fibromyalgia  Pt states that she has been researching fibromyalgia and suspects that she has this disorder. She reports severe fatigue at the end of the day and pain at trigger points.   Past Medical History  Diagnosis Date  . Allergy   . Depression   . Anxiety   . Hypertension   . OSA (obstructive sleep apnea)   . IBS (irritable bowel syndrome)    Current Outpatient Prescriptions on File Prior to Visit  Medication Sig Dispense Refill  . Naproxen Sodium (ALEVE PO) Take by mouth as needed.    . benzonatate (TESSALON) 100 MG capsule Take 1-2 capsules (100-200 mg total) by mouth 3 (three) times daily as needed for cough. 40 capsule 0  . Diphenhyd-Hydrocort-Nystatin (FIRST-DUKES  MOUTHWASH) SUSP 1 teaspoon as rinse gargle and spit 4 times a day 120 mL 1  . valACYclovir (VALTREX) 1000 MG tablet Take 1 tablet (1,000 mg total) by mouth 3 (three) times daily. 21 tablet 0   No current facility-administered medications on file prior to visit.   Allergies  Allergen Reactions  . Trazodone And Nefazodone Other (See Comments)    Per patient made sleep apnea worse   Review of Systems  Constitutional: Positive for fatigue. Negative for fever and chills.  Eyes: Negative for visual disturbance.  Respiratory: Negative for chest tightness and shortness of breath.   Cardiovascular: Negative for chest pain.  Musculoskeletal: Positive for myalgias.  Neurological: Negative for dizziness, light-headedness and headaches.  Psychiatric/Behavioral: Positive for sleep disturbance.      Objective:   Physical Exam CONSTITUTIONAL: Well developed/well nourished HEAD: Normocephalic/atraumatic EYES: EOMI/PERRL ENMT: Mucous membranes moist NECK: supple no meningeal signs, no trigger points at neck SPINE:entire spine nontender CV: S1/S2 noted, no murmurs/rubs/gallops noted LUNGS: Lungs are clear to auscultation bilaterally, no apparent distress CHEST: no masses palpable  ABDOMEN: soft, nontender, no rebound or guarding GU:no cva tenderness NEURO: Pt is awake/alert, moves all extremitiesx4 EXTREMITIES: pulses normal, full ROM, no trigger points at shoulders  SKIN: warm, color normal PSYCH: no abnormalities of mood noted    Assessment & Plan:  Will refer for an update  on her sleep apnea. She is referred to GYN for her regular Pap smear. Will try and get approval for screening mammogram for bilateral breast pain. Patient is concerned she could have fibromyalgia. I will advise her to make an appointment at 104 to be seen for this problem once she has completed her other evaluation. I personally performed the services described in this documentation, which was scribed in my presence. The  recorded information has been reviewed and is accurate.

## 2014-04-10 ENCOUNTER — Other Ambulatory Visit: Payer: Self-pay | Admitting: Emergency Medicine

## 2014-04-10 DIAGNOSIS — N644 Mastodynia: Secondary | ICD-10-CM

## 2014-04-25 ENCOUNTER — Ambulatory Visit
Admission: RE | Admit: 2014-04-25 | Discharge: 2014-04-25 | Disposition: A | Discharge status: Home / Self Care | Payer: BC Managed Care – PPO | Source: Ambulatory Visit | Attending: Emergency Medicine | Admitting: Emergency Medicine

## 2014-04-25 DIAGNOSIS — N644 Mastodynia: Secondary | ICD-10-CM

## 2014-04-28 ENCOUNTER — Encounter: Payer: Self-pay | Admitting: Neurology

## 2014-04-28 ENCOUNTER — Ambulatory Visit (INDEPENDENT_AMBULATORY_CARE_PROVIDER_SITE_OTHER): Payer: BC Managed Care – PPO | Admitting: Neurology

## 2014-04-28 VITALS — BP 149/86 | HR 59 | Resp 14 | Ht 70.5 in | Wt 228.0 lb

## 2014-04-28 DIAGNOSIS — G4733 Obstructive sleep apnea (adult) (pediatric): Secondary | ICD-10-CM

## 2014-04-28 DIAGNOSIS — E669 Obesity, unspecified: Secondary | ICD-10-CM

## 2014-04-28 DIAGNOSIS — Z9989 Dependence on other enabling machines and devices: Principal | ICD-10-CM

## 2014-04-28 DIAGNOSIS — M791 Myalgia, unspecified site: Secondary | ICD-10-CM

## 2014-04-28 MED ORDER — FEXOFENADINE HCL 30 MG PO TBDP: 30.0000 mg | ORAL_TABLET | Freq: Every day | ORAL | Status: DC

## 2014-04-28 NOTE — Addendum Note (Signed)
Addended by: Melvyn NovasHMEIER, Tinisha Etzkorn on: 04/28/2014 09:50 AM   Modules accepted: Level of Service

## 2014-04-28 NOTE — Progress Notes (Signed)
SLEEP MEDICINE CLINIC   Provider:  Melvyn Novas, M D  Referring Provider: Collene Gobble, MD Primary Care Physician:  Lucilla Edin, MD  Chief Complaint  Patient presents with  . NP OSA  (Daub)    Rm 11, alone    HPI:  Judith Francis is a 38 y.o.hispanic, right handed  female  and seen here as a referral from Dr. Cleta Alberts for a sleep evaluation,  Judith Francis was seen on 9--12-2010 for a split night polysomnography as ordered by her primary care physician, Ernie Hew. The patient was suspected to have obstructive sleep apnea at the time she complained of gasping for breath, waking up with palpitations she had morning headaches and snoring and excessive daytime T penis were reported. Her BMI at that time was 30. The AHI was 28.0 and the RDI 37.6 indicating a moderate to severe apnea and a severe upper airway resistance sleep. During REM sleep, the AHI was 66.7. There were no periodic limb movements . The patient was titrated to 12 cm water.  She reports today that she uses her machine and doesn't sleep nearly as well if she doesn't use it.  She still endorsed the fatigue score at 45 points and Epworth sleepiness score at 13 points. She brought her machine for download.  She is highly compliant and for the last 90 days as a compliance of 98% in base and 89% for over 4 hours of use. The average time of use is 5 hours 32 minutes she has an AHI of 0.7. She does have a moderate air leak.  The current set pressure is still 12 cm water with 2 cm EPR. The patient reports that it is sometimes difficult to exhale and we are  trying to reduce the EPR to  3 cm today from previously 2 cm water.   Sleep habits :  Her younger of 2 daughter sleeps in her bed, the older is 37 years old . She is single with 2 daughters.  She waits until 11 PM to go to bed, she has 2 bathroom breaks, she repositions herself frequently due to back pain. Her mouth is dry and she has water at the bed site. She  rises at  6.30- 7 AM   with an alarm, her body feels not fully restored and not refreshed. She craves 8 hours of sleep, but gets 6.5, wiihout CPAP she has severe headache and dizziness.    as she doesn't like to sleep supine- her preferred sleep position is sitting or with a pillow under her knees, due to back pain . This seated reclined position causes her jaw to drop  open , thus  causes an oral air leak.  She uses a nasal mask. Her humidifier level is high , still she has a dry mouth. Her nose is congested, and she would like to use a nasal spray. A chin strap caused her to gulp air. She feels the "trap door effect" of her soft palate. She is not claustrophobic and would like to try a FFM. She is not opposed to a nasal spray and fexofendine.   Review of Systems: Out of a complete 14 system review, the patient complains of only the following symptoms, and all other reviewed systems are negative. morning headaches, coughing, nasal drip, soft palate closing down when sleeping in reclined position, choking.  Cystic breast disease, she stopped caffeine and chocolate.   Epworth score 13  points , Fatigue severity score  45 points , depression score not obtained.    History   Social History  . Marital Status: Single    Spouse Name: N/A    Number of Children: 2  . Years of Education: N/A   Occupational History  . interpreter [employer]   Social History Main Topics  . Smoking status: Never Smoker   . Smokeless tobacco: Not on file  . Alcohol Use: No  . Drug Use: No  . Sexual Activity: No   Other Topics Concern  . Not on file   Social History Narrative   Divorced; 2 children.  Right handed.  Caffeine not daily.    Family History  Problem Relation Age of Onset  . Diabetes Mother   . Migraines Mother   . Hypertension Father   . Kidney Stones Sister   . Migraines Sister   . Gallbladder disease Sister   . Pancreatitis Brother   . Diabetes Maternal Grandmother   .  Cancer Maternal Grandmother   . Cancer Maternal Grandfather   . Cancer Paternal Grandmother   . Cancer Paternal Grandfather     Past Medical History  Diagnosis Date  . Allergy   . Depression   . Anxiety   . Hypertension   . OSA (obstructive sleep apnea)   . IBS (irritable bowel syndrome)     History reviewed. No pertinent past surgical history.  Current Outpatient Prescriptions  Medication Sig Dispense Refill  . Naproxen Sodium (ALEVE PO) Take 2 tablets by mouth as needed.     . fexofenadine (ALLEGRA ODT) 30 MG disintegrating tablet Take 1 tablet (30 mg total) by mouth daily. 30 tablet 5   No current facility-administered medications for this visit.    Allergies as of 04/28/2014 - Review Complete 04/28/2014  Allergen Reaction Noted  . Trazodone and nefazodone Other (See Comments) 12/04/2012    Vitals: BP 149/86 mmHg  Pulse 59  Resp 14  Ht 5' 10.5" (1.791 m)  Wt 228 lb (103.42 kg)  BMI 32.24 kg/m2  LMP 03/09/2014 Last Weight:  Wt Readings from Last 1 Encounters:  04/28/14 228 lb (103.42 kg)       Last Height:   Ht Readings from Last 1 Encounters:  04/28/14 5' 10.5" (1.791 m)    Physical exam:  General: The patient is awake, alert and appears not in acute distress. The patient is well groomed. Head: Normocephalic, atraumatic. Neck is supple. Mallampati 1   neck circumference: 15 . Nasal airflow nasal restricted,  TMJ is not evident . Retrognathia is not seen.  Cardiovascular:  Regular rate and rhythm , without  murmurs or carotid bruit, and without distended neck veins. Respiratory: Lungs are clear to auscultation. Skin:  Without evidence of edema, or rash Trunk: BMI is further elevated from 30 to 32.25 , the  patient  has normal posture.  Neurologic exam : The patient is awake and alert, oriented to place and time.   Memory subjective described as intact. There is a normal attention span & concentration ability. Speech is fluent without  dysarthria,  aphasia.  Mood and affect are appropriate.  Cranial nerves: Pupils are equal and briskly reactive to light. Funduscopic exam without evidence of pallor or edema. Extraocular movements  in vertical and horizontal planes intact and without nystagmus. Visual fields by finger perimetry are intact. Hearing to finger rub intact.  Facial sensation intact to fine touch. Facial motor strength is symmetric and tongue and uvula move midline.  Motor exam:  Normal tone,  muscle bulk and symmetric, strength in all extremities. She reports tenderness in upper and lower extremities, at the knee and shoulder ligaments.   Sensory:  Fine touch, pinprick and vibration were tested in all extremities. Proprioception is normal.  Coordination: Rapid alternating movements in the fingers/hands is normal. Finger-to-nose maneuver normal without evidence of ataxia, dysmetria or tremor.  Gait and station: Patient walks without assistive device and is able unassisted to climb up to the exam table. Strength within normal limits. Stance is stable and normal. Tandem gait is unfragmented. Romberg testing is  negative.  Deep tendon reflexes: in the upper and lower extremities are symmetric and intact.    Assessment:  After physical and neurologic examination, review of laboratory studies, imaging, neurophysiology testing and pre-existing records, assessment is   1) overweight , gained further weight since 3 years ago. 2) sleep apnea, OSA - was AHI 28 , now residual AHI is 0.8 at 12 cm water , will increase to 30 cm EPR and patient requested a FFM.  She was 98% compliant for days and 89% for days over 4 hours, see above.  3) myalgia , perhaps fostered by inactivity, fatigue.  4) she is lactose intolerant , she has IBS.     The patient was advised of the nature of the diagnosed sleep disorder , the treatment options and risks for general a health and wellness arising from not treating the condition. Visit duration was 45 minutes.    Plan:  Treatment plan and additional workup :  Keep CPAP at 12 cm ,  Now 3 cm EPR. [facility]  RV in 12 month with chip card and mask, best if she brings machine. Cystic breast disease improved after she stopped using caffeine. She doesn't smoke, is not drinking ETOH     Melvyn Novasarmen Jhoselyn Ruffini MD  04/28/2014

## 2014-04-28 NOTE — Patient Instructions (Signed)
Informacin sobre CPAP y BIPAP (CPAP and BIPAP Information) CPAP y BIPAP son mtodos que ayudan a Industrial/product designerrespirar con el uso de presin de Soil scientistaire. CPAP son las siglas en ingls de "presin positiva continua en las vas areas". BIPAP son las siglas en ingls de "presin positiva en las vas areas en Colgate Palmolivedos niveles" En ambos mtodos, se insufla aire en las vas respiratorias para mejorar la respiracin. En la CPAP, la cantidad de presin permanece continua durante la inspiracin y la espiracin. CPAP se Walt Disneyutiliza en los casos de apnea del sueo obstructiva. En los casos de apnea del sueo obstructiva, la CPAP funciona manteniendo abiertas las vas respiratorias de modo que no colapsen cuando los msculos se relajan durante el sueo. La BIPAP es similar a la CPAP excepto que la cantidad de presin se aumenta cuando se inspira. Esto ayuda a hacer inspiraciones ms prolongadas. El mdico indicar si la CPAP o la BIPAP lo ayudar ms.  PORQU SE UTILIZA EL TRATAMIENTO CON CPAP Y BIPAP? La CPAP o la BIPAP se utilizarn si padece:   Apnea del sueo.  Enfermedad pulmonar obstructiva crnica (EPOC).  Enfermedades que Hexion Specialty Chemicalsdebilitan los msculos del trax, incluyendo la distrofia muscular o enfermedades neurolgicas como la esclerosis lateral amiotrfica (ELA).  Otros problemas que puedan hacer que la respiracin se debilite, sea anormal o dificultosa. CMO SE ADMINISTRA LA CPAP O LA BIPAP? Tanto la CPAP como la BIPAP se administran con un pequeo dispositivo que tiene un tubo plstico flexible que se conecta a una mscara de plstico. Darrow BussingLa mscara se ajusta al rostro y el aire se insufla en las vas respiratorias a travs de la nariz o la boca. La cantidad de presin que se [place] Islandsutiliza para Company secretaryinsuflar el aire en las vas respiratorias se determina en el dispositivo. El mdico determinar la presin que debe administrarse segn sus necesidades individuales. CUNDO DEBEN UTILIZARSE LA CPAP O LA BIPAP? En la International Business Machinesmayora de los casos, la  mscara se [place] ([place] Islandsutiliza solo para dormir. Generalmente, necesitar usar la mscara por la noche y Administratordurante el da, si toma una siesta. En unos pocos casos en los que hay ciertas enfermedades mdicas, tambin se puede utilizar la mscara estando despierto. Siga las indicaciones de su mdico para saber en qu momento debe usar el dispositivo.  USO DE LA MSCARA  Debido a que la mscara debe ajustarse, Environmental manageralgunas personas se sienten atrapadas o tienen sensacin de Surveyor, miningencierro (claustrofobia) cuando usan la mscara por primera vez. Es posible que necesite acostumbrarse a la mscara gradualmente. Para hacerlo, sostenga primero la mscara de manera floja sobre la nariz o la boca. Ajuste la mscara gradualmente. Tambin puede aumentar gradualmente el tiempo que la [place]a.  Las mscaras estn disponibles en varios tipos y Veterinary surgeontamaos. Algunas se Genuine Partsajustan sobre la boca y la Carytownnariz, y otras solo sobre la Clinical cytogeneticistnariz. Si la mscara no se ajusta bien, hable con su mdico para obtener Principal Financialuna diferente.  Si est usando una mscara nasal y tiende a respirar por la boca, podr colocarse una correa en la barbilla para mantener la boca cerrada.  Los dispositivos CPAP y BIPAP tienen alarmas que suenan si la mscara se sale o hay una prdida.  Si tiene algn problema con la Raphael Gibneymscara, es muy importante que hable con su mdico para Clinical research associateencontrar un modo que le resulte fcil para tolerarla. No deje de Facilities managerusar la mscara. Esto podra tener un impacto negativo sobre su salud. CONSEJOS PARA USAR EL DISPOSITIVO  Coloque el dispositivo CPAP o BIPAP sobre una mesa segura o cerca  de un enchufe.  Sepa donde est ubicado en botn de encendido y apagado en el dispositivo.  Siga las indicaciones de su mdico sobre como configurar la presin en el dispositivo y cundo debe usarlo.  No coma ni beba nada mientras el dispositivo CPAP o BIPAP est encendido. Los alimentos o los lquidos podran ingresar en sus pulmones por la presin de los dispositivos CPAP o  BIPAP.  No fume. Los residuos del humo del tabaco pueden daar el dispositivo.  Para el uso hogareo, pueden alquilarse o comprarse los dispositivos CPAP y BIPAP en empresas especializadas en el cuidado de la salud. Hay muchas marcas disponibles de dispositivos. Alquilar un dispositivo antes de comprarlo puede ayudarlo a decidir cul tipo de dispositivo funciona mejor para usted. SOLICITE ATENCIN MDICA DE INMEDIATO SI:  Tiene irritacin o zonas abiertas alrededor de la nariz o la boca, donde la Newburgmscara se Laflinajusta.  Tiene dificultad para operar los dispositivos CPAP o BIPAP.  No puede tolerar el uso de las mscaras CPAP o BIPAP. Document Released: 09/17/2012 Document Revised: 10/07/2013 [facility] Patient Information 2015 [place]Care, [place]. This information is not intended to replace advice given to you by your health care provider. Make sure you discuss any questions you have with your health care provider.

## 2014-06-19 ENCOUNTER — Ambulatory Visit (INDEPENDENT_AMBULATORY_CARE_PROVIDER_SITE_OTHER): Payer: BC Managed Care – PPO | Admitting: Urgent Care

## 2014-06-19 VITALS — BP 110/70 | HR 73 | Temp 98.6°F | Resp 16 | Ht 69.5 in | Wt 219.1 lb

## 2014-06-19 DIAGNOSIS — L853 Xerosis cutis: Secondary | ICD-10-CM

## 2014-06-19 DIAGNOSIS — L304 Erythema intertrigo: Secondary | ICD-10-CM

## 2014-06-19 DIAGNOSIS — L299 Pruritus, unspecified: Secondary | ICD-10-CM

## 2014-06-19 MED ORDER — CETIRIZINE HCL 10 MG PO TABS: 10.0000 mg | ORAL_TABLET | Freq: Every day | ORAL | Status: DC

## 2014-06-19 MED ORDER — HYDROCORTISONE 1 % EX CREA: 1.0000 "application " | TOPICAL_CREAM | Freq: Two times a day (BID) | CUTANEOUS | Status: DC

## 2014-06-19 NOTE — Progress Notes (Signed)
    MRN: [id number] DOB: 09/26/1975  Subjective:   Judith Francis is a 39 y.o. female presenting for 3 day history of rash over her legs. Rash is very itchy, worse over inner thighs but spreads to her calves bilaterally, stings at times. Tried Calamine lotion yesterday without relief. Denies fevers, bleeding, vesicles, drainage, pain, genital or inguinal rash, shob, chest tightness, throat closing, swelling over face, abdominal pain, n/v. Of note, patient started using the sauna at her gym ~1.5 weeks ago; also, working on weight loss including walking on treadmill significant time in the past couple of weeks. No other new exposures including soaps, detergents. Has spent some time outdoors walking to pick up kids from school but not doing yardwork, coming into contact with weeds, poisonous plants or camping. Denies smoking or alcohol use. Denies any other aggravating or relieving factors, no other questions or concerns.  Judith Francis currently has no medications in their medication list.  He is allergic to trazodone and nefazodone.  Judith Francis  has a past medical history of Allergy; Depression; Anxiety; Hypertension; OSA (obstructive sleep apnea); and IBS (irritable bowel syndrome). Also  has no past surgical history on file.  ROS As in subjective.  Objective:   Vitals: BP 110/70 mmHg  Pulse 73  Temp(Src) 98.6 F (37 C) (Oral)  Resp 16  Ht 5' 9.5" (1.765 m)  Wt 219 lb 2 oz (99.394 kg)  BMI 31.91 kg/m2  SpO2 98%  LMP 05/30/2014  Physical Exam  Constitutional: She is oriented to person, place, and time and well-developed, well-nourished, and in no distress.  Cardiovascular: Normal rate, regular rhythm, normal heart sounds and intact distal pulses.  Exam reveals no gallop and no friction rub.   No murmur heard. Pulmonary/Chest: Effort normal and breath sounds normal. No respiratory distress. She has no wheezes. She has no rales. She exhibits no tenderness.  Abdominal: Bowel sounds are normal.   Neurological: She is alert and oriented to person, place, and time.  Skin:  Dry, erythematous patches over inner thighs bilaterally, spreading to a lesser degree down toward her calves.  Psychiatric: Mood and affect normal.   Assessment and Plan :   1. Dry skin 2. Chafing 3. Itching - Start hydrocortisone 1% cream, Zyrtec for itching, advised Lubriderm or Aveeno otc to moisturize skin - Recommended that she avoid walking on the treadmill for now to avoid chafing of her thighs, wear looser clothing - if no improvement in 7 days, return to clinic for reevaluation  Wallis BambergMario Macklin Jacquin, PA-C [facility] [phone] 06/19/2014 8:14 PM

## 2014-12-27 ENCOUNTER — Ambulatory Visit (INDEPENDENT_AMBULATORY_CARE_PROVIDER_SITE_OTHER): Payer: BC Managed Care – PPO

## 2014-12-27 ENCOUNTER — Ambulatory Visit (INDEPENDENT_AMBULATORY_CARE_PROVIDER_SITE_OTHER): Payer: BC Managed Care – PPO | Admitting: Emergency Medicine

## 2014-12-27 VITALS — BP 122/78 | HR 99 | Temp 98.2°F | Resp 16 | Ht 69.5 in | Wt 238.2 lb

## 2014-12-27 DIAGNOSIS — R200 Anesthesia of skin: Secondary | ICD-10-CM

## 2014-12-27 DIAGNOSIS — R208 Other disturbances of skin sensation: Secondary | ICD-10-CM

## 2014-12-27 LAB — GLUCOSE, POCT (MANUAL RESULT ENTRY): POC GLUCOSE: 139 mg/dL — AB (ref 70–99)

## 2014-12-27 LAB — POCT URINE PREGNANCY: Preg Test, Ur: NEGATIVE

## 2014-12-27 MED ORDER — GABAPENTIN 100 MG PO CAPS: ORAL_CAPSULE | ORAL | Status: DC
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
## 2014-12-27 NOTE — Patient Instructions (Signed)
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
## 2015-01-16 ENCOUNTER — Ambulatory Visit: Payer: BC Managed Care – PPO | Admitting: Neurology

## 2015-01-26 ENCOUNTER — Telehealth: Payer: Self-pay

## 2015-01-26 NOTE — Telephone Encounter (Signed)
Called patient and left her message I want to move her apt. Up with Judith Francis on Wednesday.

## 2015-01-27 NOTE — Telephone Encounter (Signed)
Called and left patient another message asking her if she can come in and see Aundra Millet this Wednesday instead of Thursday seeing Dr. Tina Griffiths. If patient calls back please put her on Megan's schedule this coming Wednesday . Please bring her CPAP machine or card. Thanks Annabelle Harman.

## 2015-01-29 ENCOUNTER — Ambulatory Visit (INDEPENDENT_AMBULATORY_CARE_PROVIDER_SITE_OTHER): Payer: BC Managed Care – PPO | Admitting: Neurology

## 2015-01-29 ENCOUNTER — Encounter: Payer: Self-pay | Admitting: Neurology

## 2015-01-29 VITALS — BP 126/70 | HR 74 | Resp 20 | Ht 69.5 in | Wt 240.0 lb

## 2015-01-29 DIAGNOSIS — R0683 Snoring: Secondary | ICD-10-CM | POA: Diagnosis not present

## 2015-01-29 DIAGNOSIS — G4733 Obstructive sleep apnea (adult) (pediatric): Secondary | ICD-10-CM | POA: Diagnosis not present

## 2015-01-29 DIAGNOSIS — Z9989 Dependence on other enabling machines and devices: Principal | ICD-10-CM

## 2015-01-29 DIAGNOSIS — J301 Allergic rhinitis due to pollen: Secondary | ICD-10-CM

## 2015-01-29 MED ORDER — MOMETASONE FUROATE 50 MCG/ACT NA SUSP: 2.0000 | Freq: Every day | NASAL | Status: DC

## 2015-01-29 NOTE — Progress Notes (Signed)
[facility]   Provider:  Melvyn Novas, M D  Referring Provider: Collene Gobble, MD Primary Care Physician:  Lucilla Edin, MD Judith Francis was seen on 9--12-2010 Chief Complaint  Patient presents with  . Follow-up    cpap, rm 11, alone    HPI:  Judith Francis is a 39 y.o.hispanic, right handed  female  and seen here as a revisit  from Dr. Cleta Alberts for CPAP compliance.  Interval history from 8-20 5-16, Judith Francis  has proven to be an excellent and compliant CPAP user ; her download over the last 30 days as obtained here in office and reviewed in her presence shows 100% compliance for 30 out of 30 days and 27 days of those was over 4 hours of consecutive use. This is a 90% compliance for time her average user time is 5 hours and 23 minutes. Her machine is still set at 12 cm water 3 cm EPR and she has a slow ramp time. The ramp time feels comfortable to her but she still states that when she wakes up as the machine is already ramp up from sleep she will feel as if she gulps air. Her residual AHI is 1.0  Her Epworth sleepiness score is 10 which is slightly elevated, her fatigue severity score is 33. She would like to feel better rested- also she acknowledges that prior to CPAP use her fatigue and sleepiness was higher than now.   The patient underwent a PSG SPLIT in 2012 , as ordered by her primary care physician, [name]. The patient was suspected to have obstructive sleep apnea. At the time she complained of gasping for breath, waking up with palpitations she had morning headaches and snoring and excessive daytime sleepiness were reported. Her BMI at that time was 30. The AHI was 28.0 and the RDI 37.6 indicating a moderate to severe apnea and a severe upper airway resistance sleep. During REM sleep, the AHI was 66.7. There were no periodic limb movements . The patient was titrated to 12 cm water.She reports today that she uses her machine and doesn't sleep nearly as  well if she doesn't use it.  She still endorsed the fatigue score at 45 points and Epworth sleepiness score at 13 points. She brought her machine for download.  She is highly compliant and for the last 90 days as a compliance of 98% in base and 89% for over 4 hours of use. The average time of use is 5 hours 32 minutes she has an AHI of 0.7. She does have a moderate air leak-  The current set pressure is still 12 cm water with 2 cm EPR. The patient reports that it is sometimes difficult to exhale and we are  trying to reduce the EPR to  3 cm today from previously 2 cm water.  Sleep habits :  Her younger of 2 daughter sleeps in her bed, the older is 38 years old . She is single with 2 daughters.  She waits until 11 PM to go to bed, she has 2 bathroom breaks, she repositions herself frequently due to back pain. Her mouth is dry and she has water at the bed site. She rises at  6.30- 7 AM   with an alarm, her body feels not fully restored and not refreshed. She craves 8 hours of sleep, but gets 6.5, wiihout CPAP she has severe headache and dizziness. She doesn't like to sleep supine- her preferred sleep  position is sitting or with a pillow under her knees, due to back pain . This seated reclined position causes her jaw to drop  open , thus  causes an oral air leak. She uses a nasal mask. Her humidifier level is high , and  still she has a dry mouth when waking .Her nose is congested, and she would like to use a nasal spray. A chin strap caused her to gulp air.  She feels the "trap door effect" of her soft palate. She is not claustrophobic and would like to try a FFM. She is not opposed to a nasal spray and fexofendine.   Review of Systems: Out of a complete 14 system review, the patient complains of only the following symptoms, and all other reviewed systems are negative. morning headaches, coughing, nasal drip, soft palate closing down when sleeping in reclined position, choking.  Cystic breast disease, she  stopped caffeine and chocolate.   Epworth score  10 from 13  points , Fatigue severity score  33 from 45 points , depression score 2 .  Social History   Social History  . Marital Status: Single    Spouse Name: N/A  . Number of Children: 2  . 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 Smoking status: Never Smoker   . Smokeless tobacco: Never Used  . Alcohol Use: No  . Drug Use: No  . Sexual Activity: No   Other Topics Concern  . Not on file   Social History Narrative   Divorced; 2 children.  Right handed.  Caffeine not daily.    Family History  Problem Relation Age of Onset  . Diabetes Mother   . Migraines Mother   . Hypertension Father   . Kidney Stones Sister   . Migraines Sister   . Gallbladder disease Sister   . Pancreatitis Brother   . Diabetes Maternal Grandmother   . Cancer Maternal Grandmother   . Cancer Maternal Grandfather   . Cancer Paternal Grandmother   . Cancer Paternal Grandfather     Past Medical History  Diagnosis Date  . Allergy   . Depression   . Anxiety   . Hypertension   . OSA (obstructive sleep apnea)   . IBS (irritable bowel syndrome)     No past surgical history on file.  No current outpatient prescriptions on file.   No current facility-administered medications for this visit.    Allergies as of 01/29/2015 - Review Complete 01/29/2015  Allergen Reaction Noted  . Trazodone and nefazodone Other (See Comments) 12/04/2012    Vitals: BP 126/70 mmHg  Pulse 74  Resp 20  Ht 5' 9.5" (1.765 m)  Wt 240 lb (108.863 kg)  BMI 34.95 kg/m2 Last Weight:  Wt Readings from Last 1 Encounters:  01/29/15 240 lb (108.863 kg)       Last Height:   Ht Readings from Last 1 Encounters:  01/29/15 5' 9.5" (1.765 m)    Physical exam:  General: The patient is awake, alert and appears not in acute distress. The patient is well groomed. Head: Normocephalic, atraumatic.  Neck is supple. Mallampati 1   neck circumference: 15 inches . Nasal airflow nasal restricted,  TMJ is not evident . Retrognathia is not seen.  Cardiovascular:  Regular rate and rhythm , without  murmurs or carotid bruit, and without distended neck veins. Respiratory: Lungs are clear to auscultation. Skin:  Without evidence of  edema, or rash Trunk: BMI is further elevated from 30 to 32.25 , the  patient  has normal posture.  Neurologic exam : The patient is awake and alert, oriented to place and time.   Memory subjective described as intact. There is a normal attention span & concentration ability. Speech is fluent without  dysarthria,  aphasia. Mood and affect are appropriate.  Cranial nerves: Pupils are equal and briskly reactive to light.  Extraocular movements  in vertical and horizontal planes intact and without nystagmus. Visual fields by finger perimetry are intact. Hearing to finger rub intact.  Facial sensation intact to fine touch. Facial motor strength is symmetric and tongue and uvula move midline. Motor exam:  Normal tone, muscle bulk and symmetric, strength in all extremities. She reports tenderness in upper and lower extremities, at the knee and shoulder ligaments.  Coordination: Rapid alternating movements in the fingers/hands is normal. Finger-to-nose maneuver normal without evidence of ataxia, dysmetria or tremor. Gait and station: Patient walks without assistive device and is able unassisted to climb up to the exam table. Strength within normal limits. Stance is stable and normal. Tandem gait is unfragmented. Romberg testing is  negative. Deep tendon reflexes: in the upper and lower extremities are symmetric and intact.    Assessment:  After physical and neurologic examination, review of laboratory studies, imaging, neurophysiology testing and pre-existing records, assessment is   1) overweight , gained further weight since 4 years ago. 2) sleep apnea, OSA - was AHI 28 , now  residual AHI is 1.0 from last years 0.8 at 12 cm water , 3 cm EPR and patient requested a FFM.  She was 100% compliant for days and 90% for days over 4 hours, see above.  3) rhinitis. She noticed more congestion after she wakes up in the morning and had wondered if the machine causes some of her rhinitis and nasal congestion. She definitely needs a new filter tubing and mask. I hope this fresh supplies that the rhinitis component may decrease or be alleviated if it is truly related to any kind of CPAP mediated allergy.    The patient was advised of the nature of the diagnosed sleep disorder , the treatment options and risks for general a health and wellness arising from not treating the condition. Visit duration was 15 minutes.  More than 50% of the face to face time was dedicated to CPAP compliance, tips to learn deeper breathing. I will offer her nasonex, too .   Plan:  Treatment plan and additional workup :    Keep CPAP at 12 cm ,  Now 3 cm EPR. FFM order to fit patient at [facility].  RV in 12 month with chip card and mask, best if she brings machine. Patient will use Nasonex at night to prep nasal passage.   Life style changes to lose weight discussed. She already changed some . Cystic breast disease improved after she stopped using caffeine.  She doesn't smoke, is not drinking ETOH     Melvyn Novas MD  01/29/2015

## 2015-01-29 NOTE — Patient Instructions (Signed)
This patient used CPAP in compliance with CMS criteria.   No adjustments neccessary .  Melvyn Novas ,  MD

## 2015-02-12 ENCOUNTER — Telehealth: Payer: Self-pay | Admitting: Neurology

## 2015-02-12 NOTE — Telephone Encounter (Signed)
Rn call patient about her compliance report for cipap. Pt stated she already went to  [facility] and got the report for her cipap.

## 2015-02-12 NOTE — Telephone Encounter (Signed)
Patient called said [facility] is requesting a compliance report on her CPAP. Patient can not find her copy and inquiring if a copy could be faxed to [facility]. She does not understand if she takes the SDS card out if will delete any information.  [facility] phone # [phone]. Patient can be reached at [phone].

## 2015-03-10 ENCOUNTER — Encounter: Payer: Self-pay | Admitting: Emergency Medicine

## 2015-05-27 ENCOUNTER — Ambulatory Visit (INDEPENDENT_AMBULATORY_CARE_PROVIDER_SITE_OTHER): Payer: BC Managed Care – PPO

## 2015-05-27 ENCOUNTER — Ambulatory Visit (INDEPENDENT_AMBULATORY_CARE_PROVIDER_SITE_OTHER): Payer: BC Managed Care – PPO | Admitting: Emergency Medicine

## 2015-05-27 VITALS — BP 132/90 | HR 67 | Temp 98.2°F | Resp 16 | Ht 70.5 in | Wt 242.0 lb

## 2015-05-27 DIAGNOSIS — M25531 Pain in right wrist: Secondary | ICD-10-CM | POA: Diagnosis not present

## 2015-05-27 DIAGNOSIS — M542 Cervicalgia: Secondary | ICD-10-CM

## 2015-05-27 DIAGNOSIS — N926 Irregular menstruation, unspecified: Secondary | ICD-10-CM | POA: Diagnosis not present

## 2015-05-27 LAB — POCT URINE PREGNANCY: Preg Test, Ur: NEGATIVE

## 2015-05-27 MED ORDER — GABAPENTIN 100 MG PO CAPS: ORAL_CAPSULE | ORAL | Status: DC

## 2015-05-27 MED ORDER — MELOXICAM 15 MG PO TABS: 15.0000 mg | ORAL_TABLET | Freq: Every day | ORAL | Status: DC

## 2015-05-27 NOTE — Patient Instructions (Signed)

## 2015-05-27 NOTE — Progress Notes (Addendum)
Patient ID: Judith Francis, female   DOB: 09-30-1975, 39 y.o.   MRN: [id number]    By signing my name below, I, Essence Howell, attest that this documentation has been prepared under the direction and in the presence of Collene Gobble, MD Electronically Signed: Charline Bills, ED Scribe 05/27/2015 at 12:05 PM.  Chief Complaint:  Chief Complaint  Patient presents with  . Hand Pain    x3 days; complaining of pain in both hands; states they were numb and weak   . Arm Pain    pain is traveling from right hand to right arm  . Edema    has off/on swelling in right/left hand  . Nausea    states she has been nausea a little  . Headache   HPI: Judith Francis is a 39 y.o. female who [facility] to [facility] [address] today complaining of constant right hand pain onset 2 days ago. Pt states that she sleeps with her wrists bent underneath her head and usually experiences secondary bilateral hand pain, joint swelling and numbness in the mornings. However, she states that these symptoms have lasted longer than usual and have persisted in her right hand for the past 2 days. She reports constant right hand pain radiates into her right arm and neck and is exacerbated with movement of her fingers. Pt states that pain, numbness and swelling in her left hand resolved yesterday. She has tried Aleve and an ace bandage without significant relief. She denies h/o carpal tunnel.   Past Medical History  Diagnosis Date  . Allergy   . Depression   . Anxiety   . Hypertension   . OSA (obstructive sleep apnea)   . IBS (irritable bowel syndrome)    History reviewed. No pertinent past surgical history. Social History   Social History  . Marital Status: Single    Spouse Name: N/A  . Number of Children: 2  . 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 Smoking status: Never Smoker   . Smokeless tobacco: Never Used  . Alcohol Use: No  . Drug  Use: No  . Sexual Activity: No   Other Topics Concern  . None   Social History Narrative   Divorced; 2 children.  Right handed.  Caffeine not daily.   Family History  Problem Relation Age of Onset  . Diabetes Mother   . Migraines Mother   . Hypertension Father   . Kidney Stones Sister   . Migraines Sister   . Gallbladder disease Sister   . Pancreatitis Brother   . Diabetes Maternal Grandmother   . Cancer Maternal Grandmother   . Cancer Maternal Grandfather   . Cancer Paternal Grandmother   . Cancer Paternal Grandfather    Allergies  Allergen Reactions  . Trazodone And Nefazodone Other (See Comments)    Per patient made sleep apnea worse   Prior to Admission medications   Medication Sig Start Date End Date Taking? Authorizing Provider  Naproxen Sodium (ALEVE PO) Take by mouth.   Yes Historical Provider, MD   ROS: The patient denies fevers, chills, night sweats, unintentional weight loss, chest pain, palpitations, wheezing, dyspnea on exertion, vomiting, abdominal pain, dysuria, hematuria, melena, weakness, or tingling. +arthralgias, +joint swelling,  +neck pain, +numbness  All other systems have been reviewed and were otherwise negative with the exception of those mentioned in the HPI and as above.    PHYSICAL EXAM: Filed Vitals:  05/27/15 1118  BP: 132/90  Pulse: 67  Temp: 98.2 F (36.8 C)  Resp: 16   Body mass index is 34.22 kg/(m^2).  General: Alert, no acute distress HEENT:  Normocephalic, atraumatic, oropharynx patent. [facility] Cardiovascular:  Regular rate and rhythm, no rubs murmurs or gallops.  No Carotid bruits, radial pulse intact. No pedal edema.  Neck: Tenderness on the R paracervical muscles.  Respiratory: Clear to auscultation bilaterally.  No wheezes, rales, or rhonchi.  No cyanosis, no use of accessory musculature Abdominal: No organomegaly, abdomen is soft and non-tender, positive bowel sounds.  No masses. Musculoskeletal: Gait intact.  No edema. Crepitation over the R upper shoulder with movement of the R arm. Swelling noted of the R hand and fingers.  Skin: No rashes. Neurologic: Facial musculature symmetric. Psychiatric: Patient acts appropriately throughout our interaction. Lymphatic: No cervical or submandibular lymphadenopathy  LABS: Results for orders placed or performed in visit on 05/27/15  POCT urine pregnancy  Result Value Ref Range   Preg Test, Ur Negative Negative   EKG/XRAY:   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 Wrist films are normal. Hand films are normal. ASSESSMENT/PLAN: I am not sure if this is a radiculopathy from her neck or whether she has neck problems and carpal tunnel syndrome. Referral made to Dr. Amanda PeaGramig. Will treat with an anti-inflammatory and Neurontin at night for now.I personally performed the services described in this documentation, which was scribed in my presence. The recorded information has been reviewed and is accurate.    Gross sideeffects, risk and benefits, and alternatives of medications d/w patient. Patient is aware that all medications have potential sideeffects and we are unable to predict every sideeffect or drug-drug interaction that may occur.  Lesle ChrisSteven Cally Nygard MD 05/27/2015 11:49 AM

## 2015-08-19 ENCOUNTER — Ambulatory Visit (INDEPENDENT_AMBULATORY_CARE_PROVIDER_SITE_OTHER): Payer: BC Managed Care – PPO | Admitting: Physician Assistant

## 2015-08-19 VITALS — BP 138/88 | HR 86 | Temp 99.8°F | Resp 16 | Ht 70.5 in | Wt 248.0 lb

## 2015-08-19 DIAGNOSIS — R110 Nausea: Secondary | ICD-10-CM | POA: Diagnosis not present

## 2015-08-19 DIAGNOSIS — R1032 Left lower quadrant pain: Secondary | ICD-10-CM

## 2015-08-19 LAB — POCT UA - MICROSCOPIC ONLY: Mucus, UA: ABSENT

## 2015-08-19 LAB — POCT URINALYSIS DIPSTICK
Bilirubin, UA: NEGATIVE
Glucose, UA: NEGATIVE
KETONES UA: NEGATIVE
Leukocytes, UA: NEGATIVE
Nitrite, UA: NEGATIVE
PH UA: 7
SPEC GRAV UA: 1.02
UROBILINOGEN UA: 1

## 2015-08-19 LAB — POCT CBC
Granulocyte percent: 86.4 %G — AB (ref 37–80)
HCT, POC: 37.7 % (ref 37.7–47.9)
Hemoglobin: 13.4 g/dL (ref 12.2–16.2)
LYMPH, POC: 0.7 (ref 0.6–3.4)
MCH: 33.5 pg — AB (ref 27–31.2)
MCHC: 35.6 g/dL — AB (ref 31.8–35.4)
MCV: 94.2 fL (ref 80–97)
MID (CBC): 0.3 (ref 0–0.9)
MPV: 7 fL (ref 0–99.8)
PLATELET COUNT, POC: 199 10*3/uL (ref 142–424)
POC Granulocyte: 6.9 (ref 2–6.9)
POC LYMPH %: 9.3 % — AB (ref 10–50)
POC MID %: 4.3 % (ref 0–12)
RBC: 4 M/uL — AB (ref 4.04–5.48)
RDW, POC: 12.5 %
WBC: 8 10*3/uL (ref 4.6–10.2)

## 2015-08-19 LAB — COMPREHENSIVE METABOLIC PANEL
ALBUMIN: 4.2 g/dL (ref 3.6–5.1)
ALK PHOS: 79 U/L (ref 33–115)
ALT: 54 U/L — AB (ref 6–29)
AST: 33 U/L — AB (ref 10–30)
BILIRUBIN TOTAL: 0.4 mg/dL (ref 0.2–1.2)
BUN: 13 mg/dL (ref 7–25)
CALCIUM: 9.3 mg/dL (ref 8.6–10.2)
CO2: 27 mmol/L (ref 20–31)
Chloride: 101 mmol/L (ref 98–110)
Creat: 0.82 mg/dL (ref 0.50–1.10)
GLUCOSE: 91 mg/dL (ref 65–99)
POTASSIUM: 4.1 mmol/L (ref 3.5–5.3)
Sodium: 134 mmol/L — ABNORMAL LOW (ref 135–146)
Total Protein: 6.6 g/dL (ref 6.1–8.1)

## 2015-08-19 LAB — POCT URINE PREGNANCY: PREG TEST UR: NEGATIVE

## 2015-08-19 NOTE — Patient Instructions (Addendum)
Your exam today was not concerning.  Your urine and blood tests today were normal which is reassuring.  We will get the results back looking at your liver, gallbladder, kidneys and electrolytes in a few days. I'll let you know these results.  This may have been a muscle spasm or irritation that caused the pain. If the pain returns and is worse or doesn't resolve, if you start having fevers or chills, if you start feeling worse overall or feel like the very heavy period recurs, please come back to be seen right away.

## 2015-08-19 NOTE — Progress Notes (Signed)
Subjective:    Patient ID: Judith Francis, female    DOB: November 16, 1975, 40 y.o.   MRN: [id number]  Chief Complaint  Patient presents with  . Abdominal Pain    pt is on her period, sharp pain on left side  . Nausea    x today   Medications, allergies, past medical history, surgical history, family history, social history and problem list reviewed and updated.  HPI  40 yof presents with sudden onset left sided abd pain.  Sitting with co-workers this am at work. Had sudden onset sharp, stabbing LLQ pain, rated 10/10. Spread across body, through to chest, down legs, etc. Started resolving after approx 30 mins. Currently 2/10 in clinic, no longer sharp now crampy. Has never had this before. Had assoc nausea. No emesis, no diarrhea, denies fevers or chills. Denies dysuria, vaginal dc, or new vaginal odor. Denies new foods.   Yesterday was start of menses and was very heavy. Today is much lighter. Was here Dec 2016 and had negative preg test at that time. Per pt she has not had sex since that time.  Review of Systems No cp, sob, hematuria. See HPI.     Objective:   Physical Exam  Constitutional: She is oriented to person, place, and time. She appears well-developed and well-nourished.  Non-toxic appearance. She does not have a sickly appearance. She does not appear ill. No distress.  BP 138/88 mmHg  Pulse 86  Temp(Src) 99.8 F (37.7 C) (Oral)  Resp 16  Ht 5' 10.5" (1.791 m)  Wt 248 lb (112.492 kg)  BMI 35.07 kg/m2  SpO2 98%  LMP 08/18/2015   Pulmonary/Chest: Effort normal and breath sounds normal.  Abdominal: Soft. Normal appearance and bowel sounds are normal. There is tenderness in the suprapubic area. There is no rigidity, no rebound, no guarding, no CVA tenderness, no tenderness at McBurney's point and negative Murphy's sign.    Mild suprapubic ttp.   Neurological: She is alert and oriented to person, place, and time.  Psychiatric: She has a normal mood and affect. Her  speech is normal and behavior is normal.   Results for orders placed or performed in visit on 08/19/15  POCT urinalysis dipstick  Result Value Ref Range   Color, UA yellow    Clarity, UA cloudy    Glucose, UA negative    Bilirubin, UA negative    Ketones, UA negative    Spec Grav, UA 1.020    Blood, UA large    pH, UA 7.0    Protein, UA trace    Urobilinogen, UA 1.0    Nitrite, UA negative    Leukocytes, UA Negative Negative  POCT CBC  Result Value Ref Range   WBC 8.0 4.6 - 10.2 K/uL   Lymph, poc 0.7 0.6 - 3.4   POC LYMPH PERCENT 9.3 (A) 10 - 50 %L   MID (cbc) 0.3 0 - 0.9   POC MID % 4.3 0 - 12 %M   POC Granulocyte 6.9 2 - 6.9   Granulocyte percent 86.4 (A) 37 - 80 %G   RBC 4.00 (A) 4.04 - 5.48 M/uL   Hemoglobin 13.4 12.2 - 16.2 g/dL   HCT, POC 29.5 62.1 - 47.9 %   MCV 94.2 80 - 97 fL   MCH, POC 33.5 (A) 27 - 31.2 pg   MCHC 35.6 (A) 31.8 - 35.4 g/dL   RDW, POC 30.8 %   Platelet Count, POC 199 142 - 424 K/uL  MPV 7.0 0 - 99.8 fL  POCT urine pregnancy  Result Value Ref Range   Preg Test, Ur Negative Negative      Assessment & Plan:   Abdominal pain, left lower quadrant - Plan: POCT UA - Microscopic Only, POCT urinalysis dipstick, POCT CBC, Comprehensive metabolic panel, POCT urine pregnancy  Nausea without vomiting --unclear etiology --ua normal, neg pred test reassuring, cbc normal no leukocytosis or anemia --cmp pending  --exam unremarkable with no rebound, guarding, peritoneal signs, pain mostly resolved at time of clinic visit --strict rtc precautions given, encouraged pt she may need imaging beyond what [facility] can provide if pain recurs and may need ER/obgyn care if pain recurs, she is agreeable   Donnajean Lopesodd M. Elasia Furnish, PA-C Physician Assistant-Certified [facility]  08/19/2015 3:18 PM

## 2015-08-21 ENCOUNTER — Ambulatory Visit (INDEPENDENT_AMBULATORY_CARE_PROVIDER_SITE_OTHER): Payer: BC Managed Care – PPO | Admitting: Physician Assistant

## 2015-08-21 VITALS — BP 122/84 | HR 91 | Temp 98.9°F | Resp 18 | Wt 243.2 lb

## 2015-08-21 DIAGNOSIS — J069 Acute upper respiratory infection, unspecified: Secondary | ICD-10-CM | POA: Diagnosis not present

## 2015-08-21 MED ORDER — HYDROCOD POLST-CPM POLST ER 10-8 MG/5ML PO SUER: 5.0000 mL | Freq: Two times a day (BID) | ORAL | Status: DC | PRN

## 2015-08-21 MED ORDER — BENZONATATE 100 MG PO CAPS: 100.0000 mg | ORAL_CAPSULE | Freq: Three times a day (TID) | ORAL | Status: DC | PRN

## 2015-08-21 MED ORDER — MAGIC MOUTHWASH W/LIDOCAINE: 10.0000 mL | ORAL | Status: DC | PRN

## 2015-08-21 NOTE — Progress Notes (Signed)
[facility] [address], [place] [address] [phone]  Date:  08/21/2015   Name:  Judith Francis   DOB:  Nov 07, 1975   MRN:  [id number]  PCP:  Lucilla Edin, MD    Chief Complaint: Sore Throat   History of Present Illness:  This is a 40 y.o. female with PMH HTN, OSA who is presenting with sore throat x 2 days. States a lot of co-workers have strep throat. 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 She is also having cough and mild nasal congestion. She felt feverish yesterday and checked temp and was 100. No longer feeling feverish today. States she feels better today compared to yesterday.  Aggravating/alleviating factors: aleve, benadryl, robitussin History of asthma: no History of env allergies: no Tobacco use: no   Review of Systems:  Review of Systems See HPI  Patient Active Problem List   Diagnosis Date Noted  . Essential hypertension 02/07/2013  . Chest pain 02/07/2013  . OSA on CPAP 12/04/2012  . Overweight(278.02) 10/22/2012    Prior to Admission medications   Medication Sig Start Date End Date Taking? Authorizing Provider  gabapentin (NEURONTIN) 100 MG capsule Take 1-2 capsules up to 3 times a day. Patient not taking: Reported on 08/19/2015 05/27/15   Collene Gobble, MD  Naproxen Sodium (ALEVE PO) Take by mouth. Reported on 08/21/2015    Historical Provider, MD    Allergies  Allergen Reactions  . Trazodone And Nefazodone Other (See Comments)    Per patient made sleep apnea worse    History reviewed. No pertinent past surgical history.  Social History  Substance Use Topics  . Smoking status: Never Smoker   . Smokeless tobacco: Never Used  . Alcohol Use: No    Family History  Problem Relation Age of Onset  . Diabetes Mother   . Migraines Mother   . Hypertension Father   . Kidney Stones Sister   . Migraines Sister   . Gallbladder disease Sister   . Pancreatitis Brother   . Diabetes Maternal Grandmother    . Cancer Maternal Grandmother   . Cancer Maternal Grandfather   . Cancer Paternal Grandmother   . Cancer Paternal Grandfather     Medication list has been reviewed and updated.  Physical Examination:  Physical Exam  Constitutional: She is oriented to person, place, and time. She appears well-developed and well-nourished. No distress.  HENT:  Head: Normocephalic and atraumatic.  Right Ear: Hearing, tympanic membrane, external ear and ear canal normal.  Left Ear: Hearing, tympanic membrane, external ear and ear canal normal.  Nose: Nose normal.  Mouth/Throat: Uvula is midline and mucous membranes are normal. Posterior oropharyngeal erythema (very mild) present. No oropharyngeal exudate or posterior oropharyngeal edema.  Eyes: Conjunctivae and lids are normal. Right eye exhibits no discharge. Left eye exhibits no discharge. No scleral icterus.  Cardiovascular: Normal rate, regular rhythm, normal heart sounds and normal pulses.   No murmur heard. Pulmonary/Chest: Effort normal and breath sounds normal. No respiratory distress. She has no wheezes. She has no rhonchi. She has no rales.  Musculoskeletal: Normal range of motion.  Lymphadenopathy:       Head (right side): No submental, no submandibular and no tonsillar adenopathy present.       Head (left side): No submental, no submandibular and no tonsillar adenopathy present.    She has no cervical adenopathy.  Neurological: She is alert and oriented to person, place, and time.  Skin: Skin is warm, dry and intact. No lesion and no rash noted.  Psychiatric: She has a normal mood and affect. Her speech is normal and behavior is normal. Thought content normal.   BP 122/84 mmHg  Pulse 91  Temp(Src) 98.9 F (37.2 C) (Oral)  Resp 18  Wt 243 lb 3.2 oz (110.315 kg)  SpO2 98%  LMP 08/18/2015  Assessment and Plan:  1. Viral URI Suspect viral URI. Oropharynx with very mild erythema, not typical of strep. Treat supportively, see below.  Return in 1 week if symptoms do not improve or at any time if symptoms worsen.  - magic mouthwash w/lidocaine SOLN; Take 10 mLs by mouth every 2 (two) hours as needed for mouth pain.  Dispense: 360 mL; Refill: 0 - chlorpheniramine-HYDROcodone (TUSSIONEX PENNKINETIC ER) 10-8 MG/5ML SUER; Take 5 mLs by mouth every 12 (twelve) hours as needed for cough.  Dispense: 100 mL; Refill: 0 - benzonatate (TESSALON) 100 MG capsule; Take 1-2 capsules (100-200 mg total) by mouth 3 (three) times daily as needed for cough.  Dispense: 40 capsule; Refill: 0   Roswell MinersNicole V. Dyke BrackettBush, PA-C, MHS [facility]  08/21/2015

## 2015-08-21 NOTE — Patient Instructions (Addendum)
Drink plenty of water (64 oz/day) and get plenty of rest. If you have been prescribed a cough syrup, do not drive or operate heavy machinery while using this medication. Tessalon during the day for cough. Gargle mouthwash every 2 hours as needed for sore throat. Do not swallow. Continue aleve and cough drops for sore throat If your symptoms are not improving in 1 week, return to clinic.     IF you received an x-ray today, you will receive an invoice from [facility]. Please contact [facility] at [phone] with questions or concerns regarding your invoice.   IF you received labwork today, you will receive an invoice from [facility]. Please contact [facility] at [phone] with questions or concerns regarding your invoice.   Our billing staff will not be able to assist you with questions regarding bills from these companies.  You will be contacted with the lab results as soon as they are available. The fastest way to get your results is to activate your My Chart account. Instructions are located on the last page of this paperwork. If you have not heard from us regarding the results in 2 weeks, please contact this office.

## 2016-01-19 ENCOUNTER — Encounter: Payer: Self-pay | Admitting: Neurology

## 2016-01-19 ENCOUNTER — Ambulatory Visit: Payer: BC Managed Care – PPO | Admitting: Neurology

## 2016-01-19 VITALS — BP 130/88 | HR 60 | Resp 20 | Ht 69.0 in | Wt 238.0 lb

## 2016-01-19 DIAGNOSIS — G4733 Obstructive sleep apnea (adult) (pediatric): Secondary | ICD-10-CM | POA: Diagnosis not present

## 2016-01-19 DIAGNOSIS — Z9989 Dependence on other enabling machines and devices: Principal | ICD-10-CM

## 2016-01-19 NOTE — Progress Notes (Signed)
SLEEP MEDICINE CLINIC   Provider:  Melvyn Novasarmen  Annalyce Lanpher, M D  Referring Provider: Collene Gobbleaub, Steven A, MD Primary Care Physician:  Lucilla EdinAUB, STEVE A, MD Judith Francis was seen on 9--12-2010 Chief Complaint  Patient presents with  . Follow-up    needs supplies, wants to switch to [employer]    HPI:  Judith Francis is a 40 y.o.hispanic, right handed  female  and seen here as a revisit  from Dr. Cleta Albertsaub for CPAP compliance.  Interval history from 01-29-15, Judith Francis  has proven to be an excellent and compliant CPAP user ; her download over the last 30 days as obtained here in office and reviewed in her presence shows 100% compliance for 30 out of 30 days and 27 days of those was over 4 hours of consecutive use. This is a 90% compliance for time her average user time is 5 hours and 23 minutes. Her machine is still set at 12 cm water 3 cm EPR and she has a slow ramp time. The ramp time feels comfortable to her but she still states that when she wakes up as the machine is already ramp up from sleep she will feel as if she gulps air. Her residual AHI is 1.0 Her Epworth sleepiness score is 10 which is slightly elevated, her fatigue severity score is 33. She would like to feel better rested- also she acknowledges that prior to CPAP use her fatigue and sleepiness was higher than now.  The patient underwent a PSG SPLIT in 2012 , as ordered by her primary care physician, Lesle ChrisSteven Daub. The patient was suspected to have obstructive sleep apnea. At the time she complained of gasping for breath, waking up with palpitations she had morning headaches and snoring and excessive daytime sleepiness were reported. Her BMI at that time was 30. The AHI was 28.0 and the RDI 37.6 indicating a moderate to severe apnea and a severe upper airway resistance sleep. During REM sleep, the AHI was 66.7.  There were no periodic limb movements . The patient was titrated to 12 cm water.She reports today that she uses her machine and  doesn't sleep nearly as well if she doesn't use it.   She still endorsed the fatigue score at 45 points and Epworth sleepiness score at 13 points. She brought her machine for download.  She is highly compliant and for the last 90 days as a compliance of 98% in base and 89% for over 4 hours of use. The average time of use is 5 hours 32 minutes she has an AHI of 0.7. She does have a moderate air leak-  The current set pressure is still 12 cm water with 2 cm EPR. The patient reports that it is sometimes difficult to exhale and we are  trying to reduce the EPR to  3 cm today from previously 2 cm water.  Sleep habits : Her younger of 2 daughter sleeps in her bed, the older is 39 years old . She is single with 2 daughters.  She waits until 11 PM to go to bed, she has 2 bathroom breaks, she repositions herself frequently due to back pain. Her mouth is dry and she has water at the bed site. She rises at  6.30- 7 AM   with an alarm, her body feels not fully restored and not refreshed. She craves 8 hours of sleep, but gets 6.5, wiihout CPAP she has severe headache and dizziness. She doesn't like to sleep supine- her preferred  sleep position is sitting or with a pillow under her knees, due to back pain . This seated reclined position causes her jaw to drop  open , thus  causes an oral air leak. She uses a nasal mask. Her humidifier level is high , and  still she has a dry mouth when waking .Her nose is congested, and she would like to use a nasal spray. A chin strap caused her to gulp air.  She feels the "trap door effect" of her soft palate. She is not claustrophobic and would like to try a FFM. She is not opposed to a nasal spray and fexofendine.   01-19-16 Judith Francis presents today with a compliance download including the night of 01/17/2016 and spanning over 30 days. His only use the machine 17 out of 30 days and only 5 days over 4 hours, which relates to a compliance rate of only 17% the residual AHI when  she uses the machine is 0.5 average user time is 1 hour and 40 minutes. She states that she needs urgently new supplies as the machine is not comfortable to use was an old mask and tubing. Machine is set at 12 cm water pressure with 3 cm EPR there is no indication that the settings need to be changed. She does have high air leaks could relate to a masked not been working well maybe she needs to change the interface type., too . Her Epworth sleepiness score was endorsed at 10 points her fatigue severity score was not endorsed.  Review of Systems: Out of a complete 14 system review, the patient complains of only the following symptoms, and all other reviewed systems are negative. morning headaches, coughing, nasal drip, soft palate closing down when sleeping in reclined position, choking.  Cystic breast disease, she stopped caffeine and chocolate.   Epworth score  10 from 13  points , Fatigue severity score  33 from 45 points , depression score 2 .  Social History   Social History  . Marital status: Single    Spouse name: N/A  . Number of children: 2  . Years of education: N/A   Occupational History  . interpreter [employer]   Social History Main Topics  . Smoking status: Never Smoker  . Smokeless tobacco: Never Used  . Alcohol use No  . Drug use: No  . Sexual activity: No   Other Topics Concern  . Not on file   Social History Narrative   Divorced; 2 children.  Right handed.  Caffeine not daily.    Family History  Problem Relation Age of Onset  . Diabetes Mother   . Migraines Mother   . Hypertension Father   . Kidney Stones Sister   . Migraines Sister   . Gallbladder disease Sister   . Pancreatitis Brother   . Diabetes Maternal Grandmother   . Cancer Maternal Grandmother   . Cancer Maternal Grandfather   . Cancer Paternal Grandmother   . Cancer Paternal Grandfather     Past Medical History:  Diagnosis Date  . Allergy   . Anxiety   . Depression   .  Hypertension   . IBS (irritable bowel syndrome)   . OSA (obstructive sleep apnea)     No past surgical history on file.  Current Outpatient Prescriptions  Medication Sig Dispense Refill  . Naproxen Sodium (ALEVE PO) Take by mouth. Reported on 08/21/2015     No current facility-administered medications for this visit.     Allergies  as of 01/19/2016 - Review Complete 01/19/2016  Allergen Reaction Noted  . Trazodone and nefazodone Other (See Comments) 12/04/2012    Vitals: BP 130/88   Pulse 60   Resp 20   Ht 5\' 9"  (1.753 m)   Wt 238 lb (108 kg)   BMI 35.15 kg/m  Last Weight:  Wt Readings from Last 1 Encounters:  01/19/16 238 lb (108 kg)       Last Height:   Ht Readings from Last 1 Encounters:  01/19/16 5\' 9"  (1.753 m)    Physical exam:  General: The patient is awake, alert and appears not in acute distress. The patient is well groomed. Head: Normocephalic, atraumatic. Neck is supple. Mallampati 1   neck circumference: 15 inches . Nasal airflow nasal restricted,  TMJ is not evident . Retrognathia is not seen.  Cardiovascular:  Regular rate and rhythm , without  murmurs or carotid bruit, and without distended neck veins. Respiratory: Lungs are clear to auscultation. Skin:  Without evidence of edema, or rash Trunk: BMI is further elevated from 30 to 32.25 , the  patient  has normal posture.  Neurologic exam : The patient is awake and alert, oriented to place and time.   Memory subjective described as intact. There is a normal attention span & concentration ability. Speech is fluent without  dysarthria,  aphasia. Mood and affect are appropriate.  Cranial nerves: Pupils are equal and briskly reactive to light.  Extraocular movements  in vertical and horizontal planes intact and without nystagmus. Visual fields by finger perimetry are intact. Hearing to finger rub intact.  Facial sensation intact to fine touch. Facial motor strength is symmetric and tongue and uvula move  midline. Motor exam:  Normal tone, muscle bulk and symmetric, strength in all extremities. She reports tenderness in upper and lower extremities, at the knee and shoulder ligaments.  Coordination: Rapid alternating movements in the fingers/hands is normal. Finger-to-nose maneuver normal without evidence of ataxia, dysmetria or tremor. Gait and station: Patient walks without assistive device and is able unassisted to climb up to the exam table. Strength within normal limits. Stance is stable and normal. Tandem gait is unfragmented. Romberg testing is  negative. Deep tendon reflexes: in the upper and lower extremities are symmetric and intact.    Assessment:  After physical and neurologic examination, review of laboratory studies, imaging, neurophysiology testing and pre-existing records, assessment is   1) overweight , gained further weight since 4 years ago. 2) sleep apnea, OSA - was AHI 28 , now residual AHI is 1.0 from last years 0.8 at 12 cm water , 3 cm EPR and patient requested a FFM.  She was 100% compliant for days and 90% for days over 4 hours, see above.  3) rhinitis. She noticed more congestion after she wakes up in the morning and had wondered if the machine causes some of her rhinitis and nasal congestion. She definitely needs a new filter tubing and mask. I hope this fresh supplies that the rhinitis component may decrease or be alleviated if it is truly related to any kind of CPAP mediated allergy.    The patient was advised of the nature of the diagnosed sleep disorder , the treatment options and risks for general a health and wellness arising from not treating the condition. Visit duration was 15 minutes.  More than 50% of the face to face time was dedicated to CPAP compliance, tips to learn deeper breathing. I will offer her nasonex, too .  Plan:  Treatment plan and additional workup :    Keep CPAP at 12 cm ,  Now 3 cm EPR.  I will asked the patient to come with me to the sleep  lab and be refitted for mask here. Sent [place] a download in 30 days with the new interface and I can order supplies through [facility], DME.   RV in 6 month with chip card and mask, best if she brings machine. See NP    Patient will use Nasonex at night to prep nasal passage.   Life style changes to lose weight discussed. She already changed some . Cystic breast disease improved after she stopped using caffeine.  She doesn't smoke, is not drinking ETOH     Melvyn Novas MD  01/19/2016

## 2016-08-17 ENCOUNTER — Encounter: Payer: Self-pay | Admitting: Family Medicine

## 2016-08-17 ENCOUNTER — Ambulatory Visit (INDEPENDENT_AMBULATORY_CARE_PROVIDER_SITE_OTHER): Payer: BC Managed Care – PPO | Admitting: Family Medicine

## 2016-08-17 VITALS — BP 126/84 | HR 73 | Temp 98.4°F | Resp 16 | Ht 69.0 in | Wt 237.6 lb

## 2016-08-17 DIAGNOSIS — Z6835 Body mass index (BMI) 35.0-35.9, adult: Secondary | ICD-10-CM | POA: Diagnosis not present

## 2016-08-17 DIAGNOSIS — Z13220 Encounter for screening for lipoid disorders: Secondary | ICD-10-CM

## 2016-08-17 DIAGNOSIS — Z131 Encounter for screening for diabetes mellitus: Secondary | ICD-10-CM

## 2016-08-17 DIAGNOSIS — R5383 Other fatigue: Secondary | ICD-10-CM | POA: Diagnosis not present

## 2016-08-17 DIAGNOSIS — Z0000 Encounter for general adult medical examination without abnormal findings: Secondary | ICD-10-CM | POA: Diagnosis not present

## 2016-08-17 DIAGNOSIS — Z01419 Encounter for gynecological examination (general) (routine) without abnormal findings: Secondary | ICD-10-CM

## 2016-08-17 DIAGNOSIS — Z9989 Dependence on other enabling machines and devices: Secondary | ICD-10-CM

## 2016-08-17 DIAGNOSIS — Z23 Encounter for immunization: Secondary | ICD-10-CM | POA: Diagnosis not present

## 2016-08-17 DIAGNOSIS — Z113 Encounter for screening for infections with a predominantly sexual mode of transmission: Secondary | ICD-10-CM | POA: Diagnosis not present

## 2016-08-17 DIAGNOSIS — G4733 Obstructive sleep apnea (adult) (pediatric): Secondary | ICD-10-CM

## 2016-08-17 DIAGNOSIS — E669 Obesity, unspecified: Secondary | ICD-10-CM | POA: Diagnosis not present

## 2016-08-17 DIAGNOSIS — IMO0001 Reserved for inherently not codable concepts without codable children: Secondary | ICD-10-CM

## 2016-08-17 LAB — POCT URINALYSIS DIP (MANUAL ENTRY)
Bilirubin, UA: NEGATIVE
Blood, UA: NEGATIVE
GLUCOSE UA: NEGATIVE
Ketones, POC UA: NEGATIVE
NITRITE UA: NEGATIVE
Protein Ur, POC: NEGATIVE
Spec Grav, UA: 1.02
UROBILINOGEN UA: 0.2
pH, UA: 6

## 2016-08-17 LAB — POC MICROSCOPIC URINALYSIS (UMFC): Mucus: ABSENT

## 2016-08-17 LAB — POCT URINE PREGNANCY: Preg Test, Ur: NEGATIVE

## 2016-08-17 NOTE — Patient Instructions (Addendum)
You will be notified of your lab results once they are received.  Increase physical activity, increase water intake this can aid in decreasing fatigue.  Keep follow up [facility] for follow-up of Sleep apnea.   IF you received an x-ray today, you will receive an invoice from [facility]. Please contact [facility] at [phone] with questions or concerns regarding your invoice.   IF you received labwork today, you will receive an invoice from [facility]. Please contact [facility] at [phone] with questions or concerns regarding your invoice.   Our billing staff will not be able to assist you with questions regarding bills from these companies.  You will be contacted with the lab results as soon as they are available. The fastest way to get your results is to activate your My Chart account. Instructions are located on the last page of this paperwork. If you have not heard from us regarding the results in 2 weeks, please contact this office.      Exercising to Stay Healthy Exercising regularly is important. It has many health benefits, such as:  Improving your overall fitness, flexibility, and endurance.  Increasing your bone density.  Helping with weight control.  Decreasing your body fat.  Increasing your muscle strength.  Reducing stress and tension.  Improving your overall health. In order to become healthy and stay healthy, it is recommended that you do moderate-intensity and vigorous-intensity exercise. You can tell that you are exercising at a moderate intensity if you have a higher heart rate and faster breathing, but you are still able to hold a conversation. You can tell that you are exercising at a vigorous intensity if you are breathing much harder and faster and cannot hold a conversation while exercising. How often should I exercise? Choose an activity that you enjoy and set realistic goals. Your health care provider can  help you to make an activity plan that works for you. Exercise regularly as directed by your health care provider. This may include:  Doing resistance training twice each week, such as:  Push-ups.  Sit-ups.  Lifting weights.  Using resistance bands.  Doing a given intensity of exercise for a given amount of time. Choose from these options:  150 minutes of moderate-intensity exercise every week.  75 minutes of vigorous-intensity exercise every week.  A mix of moderate-intensity and vigorous-intensity exercise every week. Children, pregnant women, people who are out of shape, people who are overweight, and older adults may need to consult a health care provider for individual recommendations. If you have any sort of medical condition, be sure to consult your health care provider before starting a new exercise program. What are some exercise ideas? Some moderate-intensity exercise ideas include:  Walking at a rate of 1 mile in 15 minutes.  Biking.  Hiking.  Golfing.  Dancing. Some vigorous-intensity exercise ideas include:  Walking at a rate of at least 4.5 miles per hour.  Jogging or running at a rate of 5 miles per hour.  Biking at a rate of at least 10 miles per hour.  Lap swimming.  Roller-skating or in-line skating.  Cross-country skiing.  Vigorous competitive sports, such as football, basketball, and soccer.  Jumping rope.  Aerobic dancing. What are some everyday activities that can help me to get exercise?  Yard work, such as:  Child psychotherapistushing a lawn mower.  Raking and bagging leaves.  Washing and waxing your car.  Pushing a stroller.  Shoveling snow.  Gardening.  Washing windows or floors.  How can I be more active in my day-to-day activities?  Use the stairs instead of the elevator.  Take a walk during your lunch break.  If you drive, park your car farther away from work or school.  If you take public transportation, get off one stop early and  walk the rest of the way.  Make all of your phone calls while standing up and walking around.  Get up, stretch, and walk around every 30 minutes throughout the day. What guidelines should I follow while exercising?  Do not exercise so much that you hurt yourself, feel dizzy, or get very short of breath.  Consult your health care provider before starting a new exercise program.  Wear comfortable clothes and shoes with good support.  Drink plenty of water while you exercise to prevent dehydration or heat stroke. Body water is lost during exercise and must be replaced.  Work out until you breathe faster and your heart beats faster. This information is not intended to replace advice given to you by your health care provider. Make sure you discuss any questions you have with your health care provider. Document Released: 06/25/2010 Document Revised: 10/29/2015 Document Reviewed: 10/24/2013 Elsevier Interactive Patient Education  2017 [employer].

## 2016-08-17 NOTE — Progress Notes (Signed)
Judith Francis  MRN: [id number] DOB: Nov 03, 1975  Subjective:  Judith Francis is a 41 y.o. female who presents for annual physical exam.  Currently problems include: sleep apnea in 2011 and hypertension controlled without medication.  Last menstrual period 08/14/16 Mother recently diagnosed with gynecological cancer Last dental exam: Currently working on scheduling an appointment. Last vision exam: none Last pap smear: 2014 present Vaccinations: Tetanus-would like vaccination today.      OSA-"Feeling Bad this Year" Uses CPAP machine at bedtime, although reports over the last year she has used less. Due to machine she feels that she is experiencing difficulty sleeping. Feels that initially, the CPAP was helping her achieve "good sleep". Reports over the last year increased tiredness and which is causing her to be late for work. Around mid-day, she gets very tired and can't focus.  Some days are less tired than others. She reports many absences from work related to fatigue and "not feeling well". Reports generalized pain and aches although can't describe any specifics or precipitating factors. She has an appointment with Judith Francis at [facility].  Patient Active Problem List   Diagnosis Date Noted  . Essential hypertension 02/07/2013  . Chest pain 02/07/2013  . OSA on CPAP 12/04/2012  . Overweight(278.02) 10/22/2012    Current Outpatient Prescriptions on File Prior to Visit  Medication Sig Dispense Refill  . Naproxen Sodium (ALEVE PO) Take by mouth. Reported on 08/21/2015     No current facility-administered medications on file prior to visit.     Allergies  Allergen Reactions  . Trazodone And Nefazodone Other (See Comments)    Per patient made sleep apnea worse    Social History   Social History  . Marital status: Single    Spouse name: N/A  . Number of children: 2  . Years of education: N/A   Occupational History  . interpreter [place] History Main Topics  . Smoking status: Never Smoker  . Smokeless tobacco: Never Used  . Alcohol use No  . Drug use: No  . Sexual activity: No   Other Topics Concern  . None   Social History Narrative   Divorced; 2 children.  Right handed.  Caffeine not daily.    No past surgical history on file.  Family History  Problem Relation Age of Onset  . Diabetes Mother   . Migraines Mother   . Hypertension Father   . Kidney Stones Sister   . Migraines Sister   . Gallbladder disease Sister   . Pancreatitis Brother   . Diabetes Maternal Grandmother   . Cancer Maternal Grandmother   . Cancer Maternal Grandfather   . Cancer Paternal Grandmother   . Cancer Paternal Grandfather     Review of Systems   See HPI Objective:  BP 126/84   Pulse 73   Temp 98.4 F (36.9 C) (Oral)   Resp 16   Ht '5\' 9"'  (1.753 m)   Wt 237 lb 9.6 oz (107.8 kg)   SpO2 99%   BMI 35.09 kg/m   Physical Exam  Constitutional: She is oriented to person, place, and time and well-developed, well-nourished, and in no distress.  HENT:  Head: Normocephalic and atraumatic.  Right Ear: External ear normal.  Left Ear: External ear normal.  Nose: Nose normal.  Mouth/Throat: Oropharynx is clear and moist.  Eyes: Conjunctivae and EOM are normal. Pupils are equal, round, and reactive to light.  Neck: Normal range of motion. Neck supple. No tracheal  deviation present.  Cardiovascular: Normal rate, regular rhythm, normal heart sounds and intact distal pulses.   Pulmonary/Chest: Effort normal and breath sounds normal.  Abdominal: Soft. Bowel sounds are normal. She exhibits no distension. There is no tenderness.  Genitourinary: Vagina normal, uterus normal, cervix normal, right adnexa normal and left adnexa normal. No vaginal discharge found.  Musculoskeletal: Normal range of motion.  Lymphadenopathy:    She has no cervical adenopathy.  Neurological: She is alert and oriented to person, place, and time. Gait  normal. GCS score is 15.  Skin: Skin is warm and dry.  Psychiatric: Mood, memory, affect and judgment normal.       Visual Acuity Screening   Right eye Left eye Both eyes  Without correction: '20/30 20/30 20/30 '  With correction:       Assessment and Plan :  Discussed healthy lifestyle, diet, exercise, preventative care, vaccinations, and addressed patient's concerns. Plan for follow up in 12 months. Otherwise, plan for specific conditions below.  1. Annual physical exam Age-appropriate anticipatory guidance provided.  2. Screening for diabetes mellitus - CMP14+EGFR  3. Screening, lipid - Lipid panel  4. Other fatigue - Thyroid Panel With TSH - CBC with Differential/Platelet  5. Encounter for gynecological examination - Pap IG, CT/NG NAA, and HPV (high risk)  6. Screening examination for STD (sexually transmitted disease) - RPR - HIV antibody (with reflex) - GC/Chlamydia Probe Amp  7. OSA on CPAP   -Keep follow-up with [facility]  8. Class 2 obesity with serious comorbidity and body mass index (BMI) of 35.0 to 35.9 in adult, unspecified obesity type -Increase physical activity and reduce intake of high fat, high carb foods.   Your lab results will be updated to your MyChart.   Judith Sage. Kenton Kingfisher, MSN, FNP-C [facility]

## 2016-08-18 ENCOUNTER — Encounter: Payer: Self-pay | Admitting: Family Medicine

## 2016-08-18 LAB — CMP14+EGFR
A/G RATIO: 1.6 (ref 1.2–2.2)
ALT: 30 IU/L (ref 0–32)
AST: 17 IU/L (ref 0–40)
Albumin: 4.1 g/dL (ref 3.5–5.5)
Alkaline Phosphatase: 87 IU/L (ref 39–117)
BILIRUBIN TOTAL: 0.3 mg/dL (ref 0.0–1.2)
BUN/Creatinine Ratio: 18 (ref 9–23)
BUN: 15 mg/dL (ref 6–20)
CHLORIDE: 99 mmol/L (ref 96–106)
CO2: 24 mmol/L (ref 18–29)
Calcium: 8.9 mg/dL (ref 8.7–10.2)
Creatinine, Ser: 0.83 mg/dL (ref 0.57–1.00)
GFR calc Af Amer: 103 mL/min/{1.73_m2} (ref >59)
GFR calc non Af Amer: 89 mL/min/{1.73_m2} (ref >59)
GLUCOSE: 83 mg/dL (ref 65–99)
Globulin, Total: 2.6 g/dL (ref 1.5–4.5)
POTASSIUM: 4.4 mmol/L (ref 3.5–5.2)
Sodium: 137 mmol/L (ref 134–144)
TOTAL PROTEIN: 6.7 g/dL (ref 6.0–8.5)

## 2016-08-18 LAB — CBC WITH DIFFERENTIAL/PLATELET
Basophils Absolute: 0 x10E3/uL (ref 0.0–0.2)
Basos: 0 %
EOS (ABSOLUTE): 0.1 x10E3/uL (ref 0.0–0.4)
Eos: 1 %
Hematocrit: 40.2 % (ref 34.0–46.6)
Hemoglobin: 13.2 g/dL (ref 11.1–15.9)
Immature Grans (Abs): 0.1 x10E3/uL (ref 0.0–0.1)
Immature Granulocytes: 1 %
Lymphocytes Absolute: 1.9 x10E3/uL (ref 0.7–3.1)
Lymphs: 25 %
MCH: 31.9 pg (ref 26.6–33.0)
MCHC: 32.8 g/dL (ref 31.5–35.7)
MCV: 97 fL (ref 79–97)
Monocytes Absolute: 0.6 x10E3/uL (ref 0.1–0.9)
Monocytes: 8 %
Neutrophils Absolute: 4.9 x10E3/uL (ref 1.4–7.0)
Neutrophils: 65 %
Platelets: 260 x10E3/uL (ref 150–379)
RBC: 4.14 x10E6/uL (ref 3.77–5.28)
RDW: 13.6 % (ref 12.3–15.4)
WBC: 7.5 x10E3/uL (ref 3.4–10.8)

## 2016-08-18 LAB — LIPID PANEL
CHOL/HDL RATIO: 3.4 ratio (ref 0.0–4.4)
Cholesterol, Total: 162 mg/dL (ref 100–199)
HDL: 48 mg/dL (ref >39)
LDL Calculated: 95 mg/dL (ref 0–99)
TRIGLYCERIDES: 94 mg/dL (ref 0–149)
VLDL Cholesterol Cal: 19 mg/dL (ref 5–40)

## 2016-08-18 LAB — GC/CHLAMYDIA PROBE AMP
Chlamydia trachomatis, NAA: NEGATIVE
Neisseria gonorrhoeae by PCR: NEGATIVE

## 2016-08-18 LAB — THYROID PANEL WITH TSH
FREE THYROXINE INDEX: 1.4 (ref 1.2–4.9)
T3 UPTAKE RATIO: 24 % (ref 24–39)
T4, Total: 5.9 ug/dL (ref 4.5–12.0)
TSH: 1.81 u[IU]/mL (ref 0.450–4.500)

## 2016-08-18 LAB — HIV ANTIBODY (ROUTINE TESTING W REFLEX): HIV Screen 4th Generation wRfx: NONREACTIVE

## 2016-08-18 LAB — RPR: RPR Ser Ql: NONREACTIVE

## 2016-08-19 ENCOUNTER — Encounter: Payer: Self-pay | Admitting: Family Medicine

## 2016-08-19 LAB — PAP IG, CT-NG NAA, HPV HIGH-RISK
Chlamydia, Nuc. Acid Amp: NEGATIVE
Gonococcus by Nucleic Acid Amp: NEGATIVE
HPV, HIGH-RISK: NEGATIVE
PAP Smear Comment: 0

## 2016-10-18 ENCOUNTER — Encounter (INDEPENDENT_AMBULATORY_CARE_PROVIDER_SITE_OTHER): Payer: Self-pay

## 2016-10-18 ENCOUNTER — Encounter: Payer: Self-pay | Admitting: Neurology

## 2016-10-18 ENCOUNTER — Telehealth: Payer: Self-pay

## 2016-10-18 ENCOUNTER — Ambulatory Visit (INDEPENDENT_AMBULATORY_CARE_PROVIDER_SITE_OTHER): Payer: BC Managed Care – PPO | Admitting: Neurology

## 2016-10-18 VITALS — BP 151/94 | HR 63 | Ht 69.5 in | Wt 243.0 lb

## 2016-10-18 DIAGNOSIS — G4700 Insomnia, unspecified: Secondary | ICD-10-CM

## 2016-10-18 DIAGNOSIS — Z9989 Dependence on other enabling machines and devices: Secondary | ICD-10-CM

## 2016-10-18 DIAGNOSIS — G4733 Obstructive sleep apnea (adult) (pediatric): Secondary | ICD-10-CM | POA: Diagnosis not present

## 2016-10-18 DIAGNOSIS — R5383 Other fatigue: Secondary | ICD-10-CM

## 2016-10-18 DIAGNOSIS — F329 Major depressive disorder, single episode, unspecified: Secondary | ICD-10-CM

## 2016-10-18 DIAGNOSIS — G4730 Sleep apnea, unspecified: Secondary | ICD-10-CM | POA: Diagnosis not present

## 2016-10-18 DIAGNOSIS — F32A Depression, unspecified: Secondary | ICD-10-CM

## 2016-10-18 NOTE — Telephone Encounter (Signed)
Patient came by for mask fitting. Fitted her with a Res med F20 small. She liked this mask. She explained that she turns machine on and off when she wakes up so she doesn't have that much pressure to breathe. Her ramp is set at 20 mins. Ramp only works when machine is turned off. Can we try Auto cpap 5-15 for her. She may be more compliant in this mode.

## 2016-10-18 NOTE — Telephone Encounter (Signed)
-----   Message from Peggye FothergillAngela Trotter sent at 10/18/2016 12:08 PM EDT ----- Ellwood HandlerHey Arun Herrod,   Can you help us obtain a new rx for a 2 week auto with pressures 7-15cm. Her current unit is a set pressure unit and she isn't eligible for a replacement yet.   Thanks.  Angie  ----- Message ----- From: Peggye Fothergillrotter, Angela Sent: 10/18/2016  11:23 AM To: Macon LargeMary A Ozimek, Hannie Shoe, RN  Thank you Baxter HireKristen, Order has been sent.   Angie  ----- Message ----- From: Geronimo Runninginkins, Demecia Northway, RN Sent: 10/18/2016  10:44 AM To: America BrownAngela Trotter, Mary A Ozimek  New order in!

## 2016-10-18 NOTE — Patient Instructions (Signed)
Insomnia Insomnia is a sleep disorder that makes it difficult to fall asleep or to stay asleep. Insomnia can cause tiredness (fatigue), low energy, difficulty concentrating, mood swings, and poor performance at work or school. There are three different ways to classify insomnia:  Difficulty falling asleep.  Difficulty staying asleep.  Waking up too early in the morning. Any type of insomnia can be long-term (chronic) or short-term (acute). Both are common. Short-term insomnia usually lasts for three months or less. Chronic insomnia occurs at least three times a week for longer than three months. What are the causes? Insomnia may be caused by another condition, situation, or substance, such as:  Anxiety.  Certain medicines.  Gastroesophageal reflux disease (GERD) or other gastrointestinal conditions.  Asthma or other breathing conditions.  Restless legs syndrome, sleep apnea, or other sleep disorders.  Chronic pain.  Menopause. This may include hot flashes.  Stroke.  Abuse of alcohol, tobacco, or illegal drugs.  Depression.  Caffeine.  Neurological disorders, such as Alzheimer disease.  An overactive thyroid (hyperthyroidism). The cause of insomnia may not be known. What increases the risk? Risk factors for insomnia include:  Gender. Women are more commonly affected than men.  Age. Insomnia is more common as you get older.  Stress. This may involve your professional or personal life.  Income. Insomnia is more common in people with lower income.  Lack of exercise.  Irregular work schedule or night shifts.  Traveling between different time zones. What are the signs or symptoms? If you have insomnia, trouble falling asleep or trouble staying asleep is the main symptom. This may lead to other symptoms, such as:  Feeling fatigued.  Feeling nervous about going to sleep.  Not feeling rested in the morning.  Having trouble concentrating.  Feeling irritable,  anxious, or depressed. How is this treated? Treatment for insomnia depends on the cause. If your insomnia is caused by an underlying condition, treatment will focus on addressing the condition. Treatment may also include:  Medicines to help you sleep.  Counseling or therapy.  Lifestyle adjustments. Follow these instructions at home:  Take medicines only as directed by your health care provider.  Keep regular sleeping and waking hours. Avoid naps.  Keep a sleep diary to help you and your health care provider figure out what could be causing your insomnia. Include:  When you sleep.  When you wake up during the night.  How well you sleep.  How rested you feel the next day.  Any side effects of medicines you are taking.  What you eat and drink.  Make your bedroom a comfortable place where it is easy to fall asleep:  Put up shades or special blackout curtains to block light from outside.  Use a white noise machine to block noise.  Keep the temperature cool.  Exercise regularly as directed by your health care provider. Avoid exercising right before bedtime.  Use relaxation techniques to manage stress. Ask your health care provider to suggest some techniques that may work well for you. These may include:  Breathing exercises.  Routines to release muscle tension.  Visualizing peaceful scenes.  Cut back on alcohol, caffeinated beverages, and cigarettes, especially close to bedtime. These can disrupt your sleep.  Do not overeat or eat spicy foods right before bedtime. This can lead to digestive discomfort that can make it hard for you to sleep.  Limit screen use before bedtime. This includes:  Watching TV.  Using your smartphone, tablet, and computer.  Stick to a   routine. This can help you fall asleep faster. Try to do a quiet activity, brush your teeth, and go to bed at the same time each night.  Get out of bed if you are still awake after 15 minutes of trying to  sleep. Keep the lights down, but try reading or doing a quiet activity. When you feel sleepy, go back to bed.  Make sure that you drive carefully. Avoid driving if you feel very sleepy.  Keep all follow-up appointments as directed by your health care provider. This is important. Contact a health care provider if:  You are tired throughout the day or have trouble in your daily routine due to sleepiness.  You continue to have sleep problems or your sleep problems get worse. Get help right away if:  You have serious thoughts about hurting yourself or someone else. This information is not intended to replace advice given to you by your health care provider. Make sure you discuss any questions you have with your health care provider. Document Released: 05/20/2000 Document Revised: 10/23/2015 Document Reviewed: 02/21/2014 Elsevier Interactive Patient Education  2017 [employer]  

## 2016-10-18 NOTE — Progress Notes (Signed)
[facility]   Provider:  Melvyn Novas, M D  Referring Provider: Collene Gobble, MD Primary Care Physician:  Ethelda Chick, MD Judith Francis was seen on 9--12-2010 Chief Complaint  Patient presents with  . Follow-up    cpap machine is going "crazy"    HPI:  Judith Francis is a 41 y.o.hispanic, right handed  female  and seen here as a revisit  from Dr. Cleta Alberts for CPAP compliance.   I have the pleasure of seeing Judith Francis today on 10/18/2016.  She has used CPAP 80% of the days but is not always able to use it more than 4 hours. Currently her compliance by time of use is only 47%. She uses the machine in average of 4 hours and 12 minutes with a residual AHI of 0.7 at 12 cm water pressure with 3 cm full-time EPR. The settings would not have to need adjustment however I would like her to use the machine and our more each night. We discussed her sleep habits exercise and dietary routine. She confesses that she is under a lot of stress especially with her daughter who has a disruptive sometimes explosive personality. There have been truancy issues and she has been smoking pot. She has spoken to multiple  Psychologists and was told her daughter is a borderline personality disorder.  Back to OSA on CPAP, she finds insomnia to be a hindrance. She needs a new mask, but her compliance is too low.   01-19-16, Mr. Kirtland Francis presents today with a compliance download including the night of 01/17/2016 and spanning over 30 days. His only use the machine 17 out of 30 days and only 5 days over 4 hours, which relates to a compliance rate of only 17% the residual AHI when she uses the machine is 0.5 average user time is 1 hour and 40 minutes. She states that she needs urgently new supplies as the machine is not comfortable to use was an old mask and tubing. Machine is set at 12 cm water pressure with 3 cm EPR there is no indication that the settings need to be changed. She  does have high air leaks could relate to a masked not been working well maybe she needs to change the interface type., too . Her Epworth sleepiness score was endorsed at 10 points her fatigue severity score was not endorsed.  Interval history from 01-29-15, Judith Francis  has proven to be an excellent and compliant CPAP user ; her download over the last 30 days as obtained here in office and reviewed in her presence shows 100% compliance for 30 out of 30 days and 27 days of those was over 4 hours of consecutive use. This is a 90% compliance for time her average user time is 5 hours and 23 minutes. Her machine is still set at 12 cm water 3 cm EPR and she has a slow ramp time. The ramp time feels comfortable to her but she still states that when she wakes up as the machine is already ramp up from sleep she will feel as if she gulps air. Her residual AHI is 1.0 Her Epworth sleepiness score is 10 which is slightly elevated, her fatigue severity score is 33. She would like to feel better rested- also she acknowledges that prior to CPAP use her fatigue and sleepiness was higher than now.  HPI:  The patient underwent a PSG SPLIT in 2012 , as ordered by her primary care  physician, [name]. The patient was suspected to have obstructive sleep apnea. At the time she complained of gasping for breath, waking up with palpitations she had morning headaches and snoring and excessive daytime sleepiness were reported. Her BMI at that time was 30. The AHI was 28.0 and the RDI 37.6 indicating a moderate to severe apnea and a severe upper airway resistance sleep. During REM sleep, the AHI was 66.7.  There were no periodic limb movements . The patient was titrated to 12 cm water.She reports today that she uses her machine and doesn't sleep nearly as well if she doesn't use it.  She still endorsed the fatigue score at 45 points and Epworth sleepiness score at 13 points. She brought her machine for download.  She is highly  compliant and for the last 90 days as a compliance of 98% in base and 89% for over 4 hours of use. The average time of use is 5 hours 32 minutes she has an AHI of 0.7. She does have a moderate air leak-  The current set pressure is still 12 cm water with 2 cm EPR. The patient reports that it is sometimes difficult to exhale and we are  trying to reduce the EPR to  3 cm today from previously 2 cm water.  Sleep habits : Her younger of 2 daughter sleeps in her bed, the older is 40 years old . She is single with 2 daughters.  She waits until 11 PM to go to bed, she has 2 bathroom breaks, she repositions herself frequently due to back pain. Her mouth is dry and she has water at the bed site. She rises at  6.30- 7 AM   with an alarm, her body feels not fully restored and not refreshed. She craves 8 hours of sleep, but gets 6.5, wiihout CPAP she has severe headache and dizziness. She doesn't like to sleep supine- her preferred sleep position is sitting or with a pillow under her knees, due to back pain . This seated reclined position causes her jaw to drop  open , thus  causes an oral air leak. She uses a nasal mask. Her humidifier level is high , and  still she has a dry mouth when waking .Her nose is congested, and she would like to use a nasal spray. A chin strap caused her to gulp air.  She feels the "trap door effect" of her soft palate. She is not claustrophobic and would like to try a FFM. She is not opposed to a nasal spray and fexofendine.    Review of Systems: Out of a complete 14 system review, the patient complains of only the following symptoms, and all other reviewed systems are negative. morning headaches, coughing, nasal drip, soft palate closing down when sleeping in reclined position, choking.  Cystic breast disease, she stopped caffeine and chocolate.  Snoring, worried, anxious. " I cannot feel my feet, I cannot move, have chest pain". Epworth score 12 points , Fatigue severity score  47 points  , depression score 4 .  Social History   Social History  . Marital status: Single    Spouse name: N/A  . Number of children: 2  . Years of education: N/A   Occupational History  . interpreter [employer]   Social History Main Topics  . Smoking status: Never Smoker  . Smokeless tobacco: Never Used  . Alcohol use No  . Drug use: No  . Sexual activity: No   Other Topics  Concern  . Not on file   Social History Narrative   Divorced; 2 children.  Right handed.  Caffeine not daily.    Family History  Problem Relation Age of Onset  . Diabetes Mother   . Migraines Mother   . Hypertension Father   . Kidney Stones Sister   . Migraines Sister   . Gallbladder disease Sister   . Pancreatitis Brother   . Diabetes Maternal Grandmother   . Cancer Maternal Grandmother   . Cancer Maternal Grandfather   . Cancer Paternal Grandmother   . Cancer Paternal Grandfather     Past Medical History:  Diagnosis Date  . Allergy   . Anxiety   . Depression   . Hypertension   . IBS (irritable bowel syndrome)   . OSA (obstructive sleep apnea)     No past surgical history on file.  Current Outpatient Prescriptions  Medication Sig Dispense Refill  . Naproxen Sodium (ALEVE PO) Take by mouth. Reported on 08/21/2015     No current facility-administered medications for this visit.     Allergies as of 10/18/2016 - Review Complete 10/18/2016  Allergen Reaction Noted  . Trazodone and nefazodone Other (See Comments) 12/04/2012    Vitals: BP (!) 151/94   Pulse 63   Ht 5' 9.5" (1.765 m)   Wt 243 lb (110.2 kg)   BMI 35.37 kg/m  Last Weight:  Wt Readings from Last 1 Encounters:  10/18/16 243 lb (110.2 kg)       Last Height:   Ht Readings from Last 1 Encounters:  10/18/16 5' 9.5" (1.765 m)    Physical exam:  General: The patient is awake, alert and appears not in acute distress. The patient is well groomed. Head: Normocephalic, atraumatic. Neck is supple. Mallampati 1     neck circumference: 15 inches . Nasal airflow nasal restricted,  TMJ is not evident . Retrognathia is not seen.  Cardiovascular:  Regular rate and rhythm , without  murmurs or carotid bruit, and without distended neck veins. Respiratory: Lungs are clear to auscultation. Skin:  Without evidence of edema, or rash Trunk: BMI is further elevated from 30 to 32.25 , the  patient  has normal posture.  Neurologic exam : The patient is awake and alert, oriented to place and time.   Memory subjective described as intact. There is a normal attention span & concentration ability. Speech is fluent without  dysarthria,  aphasia. Mood and affect are appropriate.  Cranial nerves: Pupils are equal and briskly reactive to light.  Extraocular movements  in vertical and horizontal planes intact and without nystagmus. Visual fields by finger perimetry are intact. Hearing to finger rub intact.  Facial sensation intact to fine touch. Facial motor strength is symmetric and tongue and uvula move midline. Deep tendon reflexes: in the upper and lower extremities are symmetric and intact.    Assessment:  After physical and neurologic examination, review of laboratory studies, imaging, neurophysiology testing and pre-existing records, assessment is   1) overweight , gained further weight since 4 years ago- anxiety elated eating disorder. 2) insomnia and sleep apnea, OSA - was AHI 28 , now residual AHI is 1.0 from last years 0.8 at 12 cm water , 3 cm EPR and patient requested a FFM.  She was poorly compliant this time, reports she is anxious , worried about her daughter.  3) wakes up with chest pressure, feeling paralyzed and cannot function- hypersomnia on week end-  Poor work performance- depression?   Plan:  Treatment plan and additional workup :    Change to auto titration 7 through 15 cm water, 3 cm EPR.  I will asked the patient to come with me to the sleep lab and be refitted for mask here. Needs to use CPAP 4  hours nightly. Sent [place] a download in 30 days with the new interface and I can order supplies through [facility], DME.  RV in 12 month with chip card and mask, best if she brings machine to see NP.  Life style changes to lose weight discussed. She already changed some; caloric intake and exercise.Cystic breast disease improved after she stopped using caffeine.     Porfirio Mylar Samentha Perham MD  10/18/2016

## 2016-10-18 NOTE — Telephone Encounter (Signed)
Ordered autoCPAP

## 2016-10-19 NOTE — Telephone Encounter (Signed)
[facility] needs an order for a 2 week auto. Dr. Vickey Hugerohmeier has put in 2 conflicting orders. I spoke to Dr. Vickey Hugerohmeier and she gave me a verbal order for a 2 week auto 7-15 cm H2O with 3 cm H2O. [facility]

## 2016-10-28 ENCOUNTER — Other Ambulatory Visit: Payer: Self-pay | Admitting: Family Medicine

## 2016-10-28 DIAGNOSIS — Z1231 Encounter for screening mammogram for malignant neoplasm of breast: Secondary | ICD-10-CM

## 2016-11-13 ENCOUNTER — Encounter: Payer: Self-pay | Admitting: Adult Health

## 2016-11-14 ENCOUNTER — Ambulatory Visit
Admission: RE | Admit: 2016-11-14 | Discharge: 2016-11-14 | Disposition: A | Discharge status: Home / Self Care | Payer: BC Managed Care – PPO | Source: Ambulatory Visit | Attending: Family Medicine | Admitting: Family Medicine

## 2016-11-14 DIAGNOSIS — Z1231 Encounter for screening mammogram for malignant neoplasm of breast: Secondary | ICD-10-CM

## 2016-11-28 ENCOUNTER — Encounter (INDEPENDENT_AMBULATORY_CARE_PROVIDER_SITE_OTHER): Payer: Self-pay

## 2016-11-28 ENCOUNTER — Encounter: Payer: Self-pay | Admitting: Adult Health

## 2016-11-28 ENCOUNTER — Ambulatory Visit (INDEPENDENT_AMBULATORY_CARE_PROVIDER_SITE_OTHER): Payer: BC Managed Care – PPO | Admitting: Adult Health

## 2016-11-28 VITALS — BP 133/77 | HR 59 | Ht 69.5 in

## 2016-11-28 DIAGNOSIS — G4700 Insomnia, unspecified: Secondary | ICD-10-CM | POA: Diagnosis not present

## 2016-11-28 DIAGNOSIS — Z9989 Dependence on other enabling machines and devices: Secondary | ICD-10-CM

## 2016-11-28 DIAGNOSIS — G4733 Obstructive sleep apnea (adult) (pediatric): Secondary | ICD-10-CM | POA: Diagnosis not present

## 2016-11-28 NOTE — Patient Instructions (Signed)
Mask refit today Compliance better but significant leak If your symptoms worsen or you develop new symptoms please let us know.

## 2016-11-28 NOTE — Progress Notes (Signed)
I agree with the assessment and plan as directed by NP .The patient is known to me .   Evalyse Stroope, MD  

## 2016-11-28 NOTE — Progress Notes (Signed)
PATIENT: Judith Francis DOB: 1976-01-06  REASON FOR VISIT: follow up- OSA on CPAP, insomnia HISTORY FROM: patient  HISTORY OF PRESENT ILLNESS: Today 11/28/16:  [name] is a 41 year old female with a history of obstructive sleep apnea on CPAP. She returns today for a compliance download. The patient was given a loaner machine for 2 weeks to use AutoSet. This download indicates that she used her machine 15 out of 15 days for compliance of 100%. She uses her machine greater than 4 hours 13 out of 15 days for compliance of 87%. Her average usage is 5 hours and 27 minutes. She was on a minimum pressure of 7 cm of water and max pressure 15 cm water with EPR of 3. Her residual AHI is 5.2. She does have a significant leak at 97.4 L/m. She states that she did try using the new mask given to her at the last visit however she did not like the lining. She also states that if she tightens her straps to avoid leaking it left a mark on her face. She states that she did switched back to her old mask which is 40 years old. She states that leaks as well. She returns today for an evaluation.   HISTORY 10/18/16: Copied from Dr. Oliva Bustard notes: I have the pleasure of seeing [name] today on 10/18/2016.   She has used CPAP 80% of the days but is not always able to use it more than 4 hours. Currently her compliance by time of use is only 47%. She uses the machine in average of 4 hours and 12 minutes with a residual AHI of 0.7 at 12 cm water pressure with 3 cm full-time EPR. The settings would not have to need adjustment however I would like her to use the machine and our more each night. We discussed her sleep habits exercise and dietary routine. She confesses that she is under a lot of stress especially with her daughter who has a disruptive sometimes explosive personality. There have been truancy issues and she has been smoking pot. She has spoken to multiple  Psychologists and was told her daughter  is a borderline personality disorder.  Back to OSA on CPAP, she finds insomnia to be a hindrance. She needs a new mask, but her compliance is too low.     REVIEW OF SYSTEMS: Out of a complete 14 system review of symptoms, the patient complains only of the following symptoms, and all other reviewed systems are negative.  Insomnia, apnea, daytime sleepiness, snoring, depression  ALLERGIES: Allergies  Allergen Reactions  . Trazodone And Nefazodone Other (See Comments)    Per patient made sleep apnea worse    HOME MEDICATIONS: Outpatient Medications Prior to Visit  Medication Sig Dispense Refill  . Naproxen Sodium (ALEVE PO) Take by mouth. Reported on 08/21/2015     No facility-administered medications prior to visit.     PAST MEDICAL HISTORY: Past Medical History:  Diagnosis Date  . Allergy   . Anxiety   . Depression   . Hypertension   . IBS (irritable bowel syndrome)   . OSA (obstructive sleep apnea)     PAST SURGICAL HISTORY: No past surgical history on file.  FAMILY HISTORY: Family History  Problem Relation Age of Onset  . Diabetes Mother   . Migraines Mother   . Hypertension Father   . Kidney Stones Sister   . Migraines Sister   . Gallbladder disease Sister   . Pancreatitis Brother   .  Diabetes Maternal Grandmother   . Cancer Maternal Grandmother   . Cancer Maternal Grandfather   . Cancer Paternal Grandmother   . Cancer Paternal Grandfather     SOCIAL HISTORY: Social History   Social History  . Marital status: Single    Spouse name: N/A  . Number of children: 2  . 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   Social History Main Topics  . Smoking status: Never Smoker  . Smokeless tobacco: Never Used  . Alcohol use No  . Drug use: No  . Sexual activity: No   Other Topics Concern  . Not on file   Social History Narrative   Divorced; 2 children.  Right handed.  Caffeine not daily.      PHYSICAL  EXAM  There were no vitals filed for this visit. There is no height or weight on file to calculate BMI.  Generalized: Well developed, in no acute distress   Neurological examination  Mentation: Alert oriented to time, place, history taking. Follows all commands speech and language fluent Cranial nerve II-XII: Pupils were equal round reactive to light. Extraocular movements were full, visual field were full on confrontational test. Facial sensation and strength were normal. Uvula tongue midline. Head turning and shoulder shrug  were normal and symmetric. Motor: Good strength throughout Sensory: Sensory testing is intact to soft touch on all 4 extremities. No evidence of extinction is noted.  Coordination: Cerebellar testing reveals good finger-nose-finger bilaterally Gait and station: Gait is normal. .  Reflexes: Deep tendon reflexes are symmetric and normal bilaterally.   DIAGNOSTIC DATA (LABS, IMAGING, TESTING) - I reviewed patient records, labs, notes, testing and imaging myself where available.  Lab Results  Component Value Date   WBC 7.5 08/17/2016   HGB 13.2 08/17/2016   HCT 40.2 08/17/2016   MCV 97 08/17/2016   PLT 260 08/17/2016      Component Value Date/Time   NA 137 08/17/2016 1150   K 4.4 08/17/2016 1150   CL 99 08/17/2016 1150   CO2 24 08/17/2016 1150   GLUCOSE 83 08/17/2016 1150   GLUCOSE 91 08/19/2015 1508   BUN 15 08/17/2016 1150   CREATININE 0.83 08/17/2016 1150   CREATININE 0.82 08/19/2015 1508   CALCIUM 8.9 08/17/2016 1150   PROT 6.7 08/17/2016 1150   ALBUMIN 4.1 08/17/2016 1150   AST 17 08/17/2016 1150   ALT 30 08/17/2016 1150   ALKPHOS 87 08/17/2016 1150   BILITOT 0.3 08/17/2016 1150   GFRNONAA 89 08/17/2016 1150   GFRAA 103 08/17/2016 1150   Lab Results  Component Value Date   CHOL 162 08/17/2016   HDL 48 08/17/2016   LDLCALC 95 08/17/2016   TRIG 94 08/17/2016   CHOLHDL 3.4 08/17/2016    Lab Results  Component Value Date   TSH 1.810  08/17/2016      ASSESSMENT AND PLAN 41 y.o. year old female  has a past medical history of Allergy; Anxiety; Depression; Hypertension; IBS (irritable bowel syndrome); and OSA (obstructive sleep apnea). here with:  1. OSA on CPAP 2. Insomnia  The patient's download shows that her compliance is better however she does have a significant leak. She will stop by our sleep lab today and have a mask refitting. Patient is advised that she will have to make sure her straps are tight in order to avoid a leak. That may leave temporary red marks on her face but this usually will resolve after she takes  the mask off. She voices understanding. She will return in 3 months for another download.  I spent 15 minutes with the patient. 50% of this time was spent reviewing her download.   Butch Penny, MSN, NP-C 11/28/2016, 7:58 AM [facility] [address], [address] [place], [place] [address] [phone]

## 2017-03-16 ENCOUNTER — Ambulatory Visit: Payer: BC Managed Care – PPO | Admitting: Neurology

## 2017-07-04 ENCOUNTER — Ambulatory Visit: Payer: BC Managed Care – PPO | Admitting: Neurology

## 2017-09-27 ENCOUNTER — Ambulatory Visit (HOSPITAL_COMMUNITY)
Admission: EM | Admit: 2017-09-27 | Discharge: 2017-09-27 | Disposition: A | Discharge status: Home / Self Care | Payer: Worker's Compensation | Attending: Family Medicine | Admitting: Family Medicine

## 2017-09-27 ENCOUNTER — Encounter (HOSPITAL_COMMUNITY): Payer: Self-pay | Admitting: Emergency Medicine

## 2017-09-27 ENCOUNTER — Ambulatory Visit (INDEPENDENT_AMBULATORY_CARE_PROVIDER_SITE_OTHER): Payer: Worker's Compensation

## 2017-09-27 DIAGNOSIS — M79645 Pain in left finger(s): Secondary | ICD-10-CM

## 2017-09-27 MED ORDER — IBUPROFEN 800 MG PO TABS: 800.0000 mg | ORAL_TABLET | Freq: Three times a day (TID) | ORAL | 0 refills | Status: DC

## 2017-09-27 NOTE — Discharge Instructions (Signed)
Please follow up with Employee health and Wellness/Occupation health tomorrow morning.   Please start taking ibuprofen to help with some of your pain.

## 2017-09-27 NOTE — ED Triage Notes (Signed)
Pt states she fell April 2nd and ever since then has had L pointer finger soreness.

## 2017-09-28 NOTE — ED Provider Notes (Signed)
[facility]    CSN: [id number] Arrival date & time: 09/27/17  1923     History   Chief Complaint Chief Complaint  Patient presents with  . Hand Pain    HPI Judith Francis is a 42 y.o. female presenting today for evaluation of left index finger injury.  Patient fell on April 2 while at school and fell forward and injured her finger.  She did not have it evaluated immediately, but does note that she had significant swelling and pain to the finger with difficulty moving.  Over time the swelling and pain has decreased, but she remains to have pain with bending her finger and cannot fully bend it.  She denies pain at rest, denies numbness or tingling.  She has been avoiding using her finger with most activities.  HPI  Past Medical History:  Diagnosis Date  . Allergy   . Anxiety   . Depression   . Hypertension   . IBS (irritable bowel syndrome)   . OSA (obstructive sleep apnea)     Patient Active Problem List   Diagnosis Date Noted  . Fatigue due to depression 10/18/2016  . Essential hypertension 02/07/2013  . Chest pain 02/07/2013  . OSA on CPAP 12/04/2012  . Obese 10/22/2012    History reviewed. No pertinent surgical history.  OB History   None      Home Medications    Prior to Admission medications   Medication Sig Start Date End Date Taking? Authorizing Provider  ibuprofen (ADVIL,MOTRIN) 800 MG tablet Take 1 tablet (800 mg total) by mouth 3 (three) times daily. 09/27/17   Atanacio Melnyk C, PA-C  Naproxen Sodium (ALEVE PO) Take by mouth. Reported on 08/21/2015    [provider]    Family History Family History  Problem Relation Age of Onset  . Diabetes Mother   . Migraines Mother   . Hypertension Father   . Kidney Stones Sister   . Migraines Sister   . Gallbladder disease Sister   . Pancreatitis Brother   . Diabetes Maternal Grandmother   . Cancer Maternal Grandmother   . Cancer Maternal Grandfather   . Cancer Paternal  Grandmother   . Cancer Paternal Grandfather     Social History Social History   Tobacco Use  . Smoking status: Never Smoker  . Smokeless tobacco: Never Used  Substance Use Topics  . Alcohol use: No    Alcohol/week: 0.0 oz  . Drug use: No     Allergies   Trazodone and nefazodone   Review of Systems Review of Systems  Constitutional: Negative for fatigue and fever.  Gastrointestinal: Negative for nausea and vomiting.  Musculoskeletal: Positive for arthralgias. Negative for joint swelling and myalgias.  Skin: Negative for color change, rash and wound.  Neurological: Negative for dizziness, weakness, numbness and headaches.     Physical Exam Triage Vital Signs ED Triage Vitals [09/27/17 1943]  Enc Vitals Group     BP (!) 152/103     Pulse Rate 72     Resp 18     Temp 98.1 F (36.7 C)     Temp src      SpO2 100 %     Weight      Height      Head Circumference      Peak Flow      Pain Score      Pain Loc      Pain Edu?      Excl. in GC?  No data found.  Updated Vital Signs BP (!) 152/103   Pulse 72   Temp 98.1 F (36.7 C)   Resp 18   LMP 09/07/2017 (Exact Date)   SpO2 100%   Visual Acuity Right Eye Distance:   Left Eye Distance:   Bilateral Distance:    Right Eye Near:   Left Eye Near:    Bilateral Near:     Physical Exam  Constitutional: She appears well-developed and well-nourished. No distress.  HENT:  Head: Normocephalic and atraumatic.  Eyes: Conjunctivae are normal.  Neck: Neck supple.  Cardiovascular: Normal rate.  Pulmonary/Chest: Effort normal. No respiratory distress.  Musculoskeletal: She exhibits no edema.  Left index finger without abnormality or swelling.  Patient does have limited flexion of her finger, has approximately 85% to 90% flexion.  Nontender to palpation, pain only elicited with full flexion.  Pain and slight weakness with finger abduction and abduction.  Neurological: She is alert.  Skin: Skin is warm and dry.    Psychiatric: She has a normal mood and affect.  Nursing note and vitals reviewed.    UC Treatments / Results  Labs (all labs ordered are listed, but only abnormal results are displayed) Labs Reviewed - No data to display  EKG None Radiology Dg Finger Index Left  Result Date: 09/27/2017 CLINICAL DATA:  Left index finger pain since a fall 09/05/2017. Initial encounter. EXAM: LEFT INDEX FINGER 2+V COMPARISON:  None. FINDINGS: There is no evidence of fracture or dislocation. There is no evidence of arthropathy or other focal bone abnormality. Soft tissues are unremarkable. IMPRESSION: Normal exam. Electronically Signed   By: Drusilla Kannerhomas  Dalessio M.D.   On: 09/27/2017 20:18    Procedures Procedures (including critical care time)  Medications Ordered in UC Medications - No data to display   Initial Impression / Assessment and Plan / UC Course  I have reviewed the triage vital signs and the nursing notes.  Pertinent labs & imaging results that were available during my care of the patient were reviewed by me and considered in my medical decision making (see chart for details).     No abnormality seen on x-ray.  Possible tendon injury versus stiffness related to avoiding use.  Likely needs follow-up with orthopedics and/or occupational therapy to regain full flexion. Discussed strict return precautions. Patient verbalized understanding and is agreeable with plan.   Final Clinical Impressions(s) / UC Diagnoses   Final diagnoses:  Finger pain, left    ED Discharge Orders        Ordered    ibuprofen (ADVIL,MOTRIN) 800 MG tablet  3 times daily     09/27/17 2019       Controlled Substance Prescriptions  Controlled Substance Registry consulted? Not Applicable   Lew DawesWieters, Jenee Spaugh C, [place]PA-C 09/28/17 1005

## 2018-10-08 ENCOUNTER — Emergency Department (HOSPITAL_BASED_OUTPATIENT_CLINIC_OR_DEPARTMENT_OTHER): Payer: BC Managed Care – PPO

## 2018-10-08 ENCOUNTER — Other Ambulatory Visit: Payer: Self-pay

## 2018-10-08 ENCOUNTER — Emergency Department (HOSPITAL_BASED_OUTPATIENT_CLINIC_OR_DEPARTMENT_OTHER)
Admission: EM | Admit: 2018-10-08 | Discharge: 2018-10-08 | Disposition: A | Discharge status: Home / Self Care | Payer: BC Managed Care – PPO | Attending: Emergency Medicine | Admitting: Emergency Medicine

## 2018-10-08 ENCOUNTER — Encounter (HOSPITAL_BASED_OUTPATIENT_CLINIC_OR_DEPARTMENT_OTHER): Payer: Self-pay | Admitting: Emergency Medicine

## 2018-10-08 DIAGNOSIS — R51 Headache: Secondary | ICD-10-CM | POA: Insufficient documentation

## 2018-10-08 DIAGNOSIS — I10 Essential (primary) hypertension: Secondary | ICD-10-CM | POA: Insufficient documentation

## 2018-10-08 DIAGNOSIS — R519 Headache, unspecified: Secondary | ICD-10-CM

## 2018-10-08 LAB — BASIC METABOLIC PANEL
Anion gap: 6 (ref 5–15)
BUN: 11 mg/dL (ref 6–20)
CO2: 27 mmol/L (ref 22–32)
Calcium: 9 mg/dL (ref 8.9–10.3)
Chloride: 105 mmol/L (ref 98–111)
Creatinine, Ser: 0.91 mg/dL (ref 0.44–1.00)
GFR calc Af Amer: >60 mL/min (ref >60)
GFR calc non Af Amer: >60 mL/min (ref >60)
Glucose, Bld: 127 mg/dL — ABNORMAL HIGH (ref 70–99)
Potassium: 4.1 mmol/L (ref 3.5–5.1)
Sodium: 138 mmol/L (ref 135–145)

## 2018-10-08 LAB — CBC
HCT: 39.9 % (ref 36.0–46.0)
Hemoglobin: 13.5 g/dL (ref 12.0–15.0)
MCH: 32.5 pg (ref 26.0–34.0)
MCHC: 33.8 g/dL (ref 30.0–36.0)
MCV: 95.9 fL (ref 80.0–100.0)
Platelets: 249 10*3/uL (ref 150–400)
RBC: 4.16 MIL/uL (ref 3.87–5.11)
RDW: 12.4 % (ref 11.5–15.5)
WBC: 7.5 10*3/uL (ref 4.0–10.5)
nRBC: 0 % (ref 0.0–0.2)

## 2018-10-08 LAB — PREGNANCY, URINE: Preg Test, Ur: NEGATIVE

## 2018-10-08 MED ORDER — HYDROCHLOROTHIAZIDE 25 MG PO TABS: 25.0000 mg | ORAL_TABLET | Freq: Every day | ORAL | 0 refills | Status: DC

## 2018-10-08 MED ORDER — HYDROCHLOROTHIAZIDE 25 MG PO TABS
25.0000 mg | ORAL_TABLET | Freq: Once | ORAL | Status: AC
  Administered 2018-10-08: 15:00:00 25 mg via ORAL
  Filled 2018-10-08: qty 1

## 2018-10-08 MED FILL — HYDROCHLOROTHIAZIDE 25 MG T: 25 | 30 days supply | Qty: 30 | Fill #0

## 2018-10-08 NOTE — Discharge Instructions (Signed)
Your work-up today is reassuring.  I suspect your headaches may be related to your elevated blood pressures and I would like to start you on HCTZ once daily in the morning.  This medication can cause you to have frequent urination.  It is very important that you follow-up with your primary care doctor within the next month for recheck and continued management of this medication, they will likely recheck your kidney function to make sure you are tolerating it well.  Check your blood pressure daily before and after taking your blood pressure medication and keep a log written down for your primary care doctor.

## 2018-10-08 NOTE — ED Triage Notes (Signed)
Pt sent to ED by pmd for c/o elevated bp and Headaches.  Pt first stated it was for about a week, but then said "for a long time really."

## 2018-10-08 NOTE — ED Provider Notes (Signed)
[facility] EMERGENCY DEPARTMENT Provider Note   CSN: [id number] Arrival date & time: 10/08/18  1219    History   Chief Complaint Chief Complaint  Patient presents with   elevated bp and HAs    HPI Judith Francis is a 43 y.o. female.     [name] is a 43 y.o. female with a history of hypertension, IBS, sleep apnea, depression and anxiety, who presents to the emergency department for evaluation of headaches and elevated blood pressures.  Patient reports that for the past week she has been having daily headaches across the front of her head they seem to come on and then go away gradually throughout the day.  They are not sudden in onset, she reports this is not the most severe headache of her life.  She reports some light sensitivity, no associated nausea or vomiting, no blurry vision or vision changes, no dizziness.  No numbness weakness or tingling in her extremities.  She also reports that she has been checking her blood pressure throughout the week and has noted that it is been elevated typically in the 170s over 100s.  She has not been on blood pressure medication previously.  She thinks that the fact that she has been without her CPAP machine for the past year may be contributing to her worsening blood pressure and headaches which she has been unable to follow-up with her PCP to get this set back up and when she called her PCP about her symptoms today recommended she present to the emergency department.  She denies any associated chest pain, shortness of breath, lower extremity swelling.  No abdominal pain, nausea or vomiting.  No fevers or cough.  She has not taken anything for her symptoms.  She does report that for years she has had frequent headaches, and reports that she frequently has body aches all over for the past several years, but headaches this week seem worse and more frequent.  Brought in her home blood pressure cuff and was able to show several  readings from the past week that were elevated more than her usual.       Past Medical History:  Diagnosis Date   Allergy    Anxiety    Depression    Hypertension    IBS (irritable bowel syndrome)    OSA (obstructive sleep apnea)     Patient Active Problem List   Diagnosis Date Noted   Fatigue due to depression 10/18/2016   Essential hypertension 02/07/2013   Chest pain 02/07/2013   OSA on CPAP 12/04/2012   Obese 10/22/2012    History reviewed. No pertinent surgical history.   OB History   No obstetric history on file.      Home Medications    Prior to Admission medications   Medication Sig Start Date End Date Taking? Authorizing Provider  hydrochlorothiazide (HYDRODIURIL) 25 MG tablet Take 1 tablet (25 mg total) by mouth daily. 10/08/18   Dartha Lodge, PA-C    Family History Family History  Problem Relation Age of Onset   Diabetes Mother    Migraines Mother    Hypertension Father    Kidney Stones Sister    Migraines Sister    Gallbladder disease Sister    Pancreatitis Brother    Diabetes Maternal Grandmother    Cancer Maternal Grandmother    Cancer Maternal Grandfather    Cancer Paternal Grandmother    Cancer Paternal Grandfather     Social History Social History  Tobacco Use   Smoking status: Never Smoker   Smokeless tobacco: Never Used  Substance Use Topics   Alcohol use: No    Alcohol/week: 0.0 standard drinks   Drug use: No     Allergies   Trazodone and nefazodone   Review of Systems Review of Systems  Constitutional: Negative for chills and fever.  HENT: Negative.   Eyes: Positive for photophobia. Negative for pain and visual disturbance.  Respiratory: Negative for cough and shortness of breath.   Cardiovascular: Negative for chest pain and leg swelling.  Gastrointestinal: Negative for abdominal pain, nausea and vomiting.  Genitourinary: Negative for dysuria, frequency and hematuria.  Musculoskeletal:  Negative for arthralgias, back pain and neck pain.  Skin: Negative for color change and rash.  Neurological: Positive for headaches. Negative for dizziness, tremors, seizures, syncope, facial asymmetry, speech difficulty, weakness, light-headedness and numbness.  All other systems reviewed and are negative.    Physical Exam Updated Vital Signs BP (!) 159/97 (BP Location: Right Arm)    Pulse 74    Temp 98.8 F (37.1 C) (Oral)    Resp 16    Ht 5' 9.5" (1.765 m)    Wt 106.6 kg    LMP 09/24/2018    SpO2 97%    BMI 34.21 kg/m   Physical Exam Vitals signs and nursing note reviewed.  Constitutional:      General: She is not in acute distress.    Appearance: Normal appearance. She is well-developed and normal weight. She is not ill-appearing or diaphoretic.  HENT:     Head: Normocephalic and atraumatic.     Mouth/Throat:     Mouth: Mucous membranes are moist.     Pharynx: Oropharynx is clear.  Eyes:     General:        Right eye: No discharge.        Left eye: No discharge.     Extraocular Movements: Extraocular movements intact.     Conjunctiva/sclera: Conjunctivae normal.     Pupils: Pupils are equal, round, and reactive to light.  Neck:     Musculoskeletal: Neck supple.  Cardiovascular:     Rate and Rhythm: Normal rate and regular rhythm.     Pulses: Normal pulses.     Heart sounds: Normal heart sounds. No murmur. No friction rub. No gallop.   Pulmonary:     Effort: Pulmonary effort is normal. No respiratory distress.     Breath sounds: Normal breath sounds. No wheezing or rales.     Comments: Respirations equal and unlabored, patient able to speak in full sentences, lungs clear to auscultation bilaterally Abdominal:     General: Bowel sounds are normal. There is no distension.     Palpations: Abdomen is soft. There is no mass.     Tenderness: There is no abdominal tenderness. There is no guarding.     Comments: Abdomen soft, nondistended, nontender to palpation in all  quadrants without guarding or peritoneal signs  Musculoskeletal:        General: No deformity.     Right lower leg: No edema.     Left lower leg: No edema.  Skin:    General: Skin is warm and dry.     Capillary Refill: Capillary refill takes less than 2 seconds.  Neurological:     Mental Status: She is alert and oriented to person, place, and time.     Coordination: Coordination normal.     Comments: Speech is clear, able to follow commands CN  III-XII intact Normal strength in upper and lower extremities bilaterally including dorsiflexion and plantar flexion, strong and equal grip strength Sensation normal to light and sharp touch Moves extremities without ataxia, coordination intact   Psychiatric:        Mood and Affect: Mood normal.        Behavior: Behavior normal.      ED Treatments / Results  Labs (all labs ordered are listed, but only abnormal results are displayed) Labs Reviewed  BASIC METABOLIC PANEL - Abnormal; Notable for the following components:      Result Value   Glucose, Bld 127 (*)    All other components within normal limits  CBC  PREGNANCY, URINE    EKG None  Radiology Ct Head Wo Contrast  Result Date: 10/08/2018 CLINICAL DATA:  Headache.  Hypertension. EXAM: CT HEAD WITHOUT CONTRAST TECHNIQUE: Contiguous axial images were obtained from the base of the skull through the vertex without intravenous contrast. COMPARISON:  None. FINDINGS: Brain: No evidence of acute infarction, hemorrhage, hydrocephalus, extra-axial collection or mass lesion/mass effect. Vascular: No hyperdense vessel or unexpected calcification. Skull: Normal. Sinuses/Orbits: Normal. Other: None IMPRESSION: Normal exam. Electronically Signed   By: Francene BoyersJames  Maxwell M.D.   On: 10/08/2018 13:53    Procedures Procedures (including critical care time)  Medications Ordered in ED Medications  hydrochlorothiazide (HYDRODIURIL) tablet 25 mg (25 mg Oral Given 10/08/18 1456)     Initial Impression  / Assessment and Plan / ED Course  I have reviewed the triage vital signs and the nursing notes.  Pertinent labs & imaging results that were available during my care of the patient were reviewed by me and considered in my medical decision making (see chart for details).  Patient presents for evaluation of frequent headaches over the past week and elevated blood pressures.  Has not previously been on blood pressure medication, patient brings in records of her blood pressures over the past week which have frequently been around 170s/100s.  I suspect that her elevated blood pressures may be contributing to her headaches she has no vision changes or other associated neurologic symptoms with her headaches.  Does have a history of previous headaches in the past.  Headache story does not seem consistent with subarachnoid hemorrhage.  Low suspicion for PRES, and I doubt ICH given reassuring neuro exam but will check head CT.  She does not have any other symptoms to suggest end organ damage from elevated blood pressures, no chest pain, shortness of breath, lower extremity swelling.  No symptoms to suggest aortic dissection.  Will check basic labs and EKG.  Will give headache cocktail.  Labs very reassuring, no leukocytosis, normal hemoglobin, glucose is slightly elevated at 127 but patient was not fasting, all other electrolytes are unremarkable and patient has normal renal function, no evidence of kidney damage from elevated blood pressures on lab work today.  Pregnancy is negative.  Her head CT shows no abnormalities.  Her headache is improved with treatment here in the department her blood pressure remains elevated here today we will start her on 25 mg HCTZ, first dose given here.  I have prescribed her a one-month supply but stressed the importance of her monitoring her blood pressure daily and recording this, and that she will need to follow-up with a primary doctor within the next month for blood pressure  recheck and a recheck of her kidney function and electrolytes so they can adjust her medication appropriately.  I have also discussed symptoms of low blood  pressure for her to watch out for.  She expresses understanding and agreement with this plan.  Discharged home in good condition.  Vitals:   10/08/18 1233 10/08/18 1438  BP: (!) 159/97 (!) 154/102  Pulse: 74 72  Resp: 16 16  Temp: 98.8 F (37.1 C)   SpO2: 97% 96%     Final Clinical Impressions(s) / ED Diagnoses   Final diagnoses:  Hypertension, unspecified type  Acute nonintractable headache, unspecified headache type    ED Discharge Orders         Ordered    hydrochlorothiazide (HYDRODIURIL) 25 MG tablet  Daily     10/08/18 1510           Dartha Lodge, [place] 10/11/18 1030    Little, Ambrose [place], MD 10/11/18 1110

## 2018-10-18 ENCOUNTER — Encounter: Payer: Self-pay | Admitting: Neurology

## 2018-10-18 ENCOUNTER — Telehealth: Payer: Self-pay | Admitting: Neurology

## 2018-10-18 NOTE — Telephone Encounter (Signed)
Due to current COVID 19 pandemic, our office is severely reducing in office visits, in order to minimize the risk to our patients and healthcare providers.   Pt understands that although there may be some limitations with this type of visit, we will take all precautions to reduce any security or privacy concerns.  Pt understands that this will be treated like an in office visit and we will file with pt's insurance, and there may be a patient responsible charge related to this service.  Pt's email is [email or web address]. Pt will be using [employer] for their virtual visit. Pt understands that the nurse will be calling to go over pt's chart.

## 2018-10-18 NOTE — Telephone Encounter (Signed)
Called the patient to review their chart and made sure that everything was up to date. Patient informed they received the e-mail/text message for the visit. Instructed to make sure they hold on to the e-mail/text for the upcoming appointment as it is necessary to access their appointment. Instructed the patient that apx 30 min prior to the appointment the front staff will contact them to make sure they are ready to go for their appointment in case there is any need for troubleshooting it can be completed prior to the appointment time. Reminded the patient once more that this is treated as a Office visit and the patient must be prepared for the visit and ready at the time of their appointment preferably in a well lit area where they have good connection for the visit. Pt verbalized understanding.  DME [facility] in [place]. Patient has not used the machine because her mask is broken and she couldn't meet compliance due to this in order. Pt has saved up enough money to buy the mask out of pocket to start back up using the machine. If set up date is correct the patient is technically due for new machine. Old one was set up 2012

## 2018-10-18 NOTE — Addendum Note (Signed)
Addended by: Judi Cong on: 10/18/2018 10:32 AM   Modules accepted: Orders

## 2018-10-22 ENCOUNTER — Other Ambulatory Visit: Payer: Self-pay

## 2018-10-22 ENCOUNTER — Ambulatory Visit (INDEPENDENT_AMBULATORY_CARE_PROVIDER_SITE_OTHER): Payer: BC Managed Care – PPO | Admitting: Neurology

## 2018-10-22 ENCOUNTER — Encounter: Payer: Self-pay | Admitting: Neurology

## 2018-10-22 DIAGNOSIS — R072 Precordial pain: Secondary | ICD-10-CM | POA: Diagnosis not present

## 2018-10-22 DIAGNOSIS — G4700 Insomnia, unspecified: Secondary | ICD-10-CM | POA: Diagnosis not present

## 2018-10-22 DIAGNOSIS — G4730 Sleep apnea, unspecified: Secondary | ICD-10-CM

## 2018-10-22 DIAGNOSIS — I10 Essential (primary) hypertension: Secondary | ICD-10-CM | POA: Diagnosis not present

## 2018-10-22 DIAGNOSIS — G4733 Obstructive sleep apnea (adult) (pediatric): Secondary | ICD-10-CM

## 2018-10-22 DIAGNOSIS — Z9989 Dependence on other enabling machines and devices: Secondary | ICD-10-CM

## 2018-10-22 DIAGNOSIS — G4710 Hypersomnia, unspecified: Secondary | ICD-10-CM

## 2018-10-22 NOTE — Patient Instructions (Signed)
Estudio de deteccin de la apnea del sueo Screening for Sleep Apnea  La apnea del sueo es una afeccin en la que la respiracin se detiene o se hace superficial durante el sueo. El estudio de deteccin de la apnea del sueo se realiza para determinar si una persona est en riesgo de tener apnea del sueo. El estudio es sencillo y solo dura unos minutos. El mdico puede solicitar este estudio como preparacin para una ciruga o como parte de un examen fsico. Cules son los sntomas de la apnea del sueo? Algunos de los sntomas comunes de la apnea del sueo son los siguientes:  Ronquidos.  Sueo agitado.  Somnolencia Administrator.  Pausas en la respiracin.  Ahogos durante el sueo.  Irritabilidad.  Olvidos.  Dificultad para pensar claramente.  Depresin.  Cambios en la personalidad. La mayora de las personas con apnea del sueo no saben que la tienen. Por qu debera hacerme el estudio? Realizarse el estudio para la deteccin de la apnea del sueo puede ayudar a:  Lawyer su seguridad. Es importante que sus mdicos sepan si tiene apnea del sueo o no, especialmente si se va a someter a [place] o tiene [place] afeccin de salud de largo plazo (crnica).  Mejorar su salud y permitirle descansar mejor por la noche. Un sueo reparador puede ayudarle a: ? Tener ms energa. ? Adelgazar. ? Mejorar la presin arterial alta. ? Mejorar el control de la diabetes. ? Prevenir un accidente cerebrovascular. ? Prevenir los accidentes automovilsticos. Cmo se realiza este estudio? En general, el estudio [employer] serie de preguntas sobre la calidad de su sueo. Algunas de las preguntas que pueden hacerle son las siguientes:  Ronca?  Duerme mal por la noche?  Tiene somnolencia Administrator?  Su pareja o cnyuge le han dicho que deja de respirar [place] duerme?  Ha tenido dificultad para concentrarse o prdida de la memoria? Si el resultado del estudio  es positivo, est en riesgo de tener esta afeccin. Es posible que sean necesarios otros estudios para confirmar el diagnstico de apnea del sueo. Dnde encontrar ms informacin Puede encontrar recursos para la deteccin de la apnea del sueo en Internet o en su centro mdico. Si desea obtener ms informacin sobre la deteccin de la apnea del sueo y un sueo saludable, visite estos sitios web:  Centros para Air traffic controller y la Prevencin de Child psychotherapist for Disease Control and Prevention, CDC): DetailSports.is  Asociacin Estadoun[employer]ense de la Apnea del Copywriter, advertising (American Sleep Apnea Association): www.sleepapnea.org Comunquese con un mdico si:  Piensa que puede tener apnea del sueo. Resumen  El estudio de deteccin de la apnea del sueo puede ayudar a determinar si una persona est en riesgo de tener apnea del sueo.  Es importante que sus mdicos sepan si tiene apnea del sueo o no, especialmente si se va a someter a [place] o tiene [place] afeccin de salud crnica.  Es posible que le soliciten un estudio de deteccin de la apnea del sueo en preparacin para una ciruga o como parte de un examen fsico. Esta informacin no tiene Theme park manager el consejo del mdico. Asegrese de hacerle al mdico cualquier pregunta que tenga. Document Released: 02/15/2017 Document Revised: 02/15/2017 Document Reviewed: 02/15/2017 Elsevier Interactive Patient Education  [employer].

## 2018-10-22 NOTE — Progress Notes (Signed)
SLEEP MEDICINE CLINIC   Provider:  Melvyn Novas, M D  Referring Provider: Aliene Beams, MD Primary Care Physician:  Hoyt Koch, MD (Inactive) [name] was seen on 9--12-2010 No chief complaint on file.   HPI:  Judith Francis is a 43 y.o.hispanic, right handed  female  and seen here as a revisit  from Dr. Tracie Harrier for CPAP compliance.   Virtual Visit via Video Note  I connected with Hyman Bower on 10/22/18 at  2:00 PM EDT by a video enabled telemedicine application and verified that I am speaking with the correct person using two identifiers.  Location: Patient: at home Provider: at [facility]    I discussed the limitations of evaluation and management by telemedicine and the availability of in person appointments. The patient expressed understanding and agreed to proceed.  History of Present Illness: Mrs. Daisy Larusso- Levin Erp is a meanwhile 43 year old female patient and non-smoker who was last followed in this office on 28 November 2016 by nurse practitioner Everlene Other.  The patient had been diagnosed with obstructive sleep apnea, her risk factors were assessed, but she struggled and continued to gain weight, she became noncompliant with CPAP and presented at her last visit with a chief complaint of insomnia. Most recently she had a phone conversation with her primary care office, Dr. Darl Pikes on 21 August 2018.  She stated that she had been coughing and has yellow and clear phlegm.  She took Mucinex but also NyQuil and DayQuil.  The patient was given a Z pack on 08-21-2018.   At the time she was concerned that she has not received CPAP Supplies (? ) As stated, the patient has not had a repeat sleep study and based on low compliance would not have coverage for supplies, given the previously sporadic CPAP use.   Her baseline polysomnography turned into a split-night on 11 February 2011, she had an AHI of 37.6, REM sleep AHI 66.7, she was titrated to 12 cmH2O  CPAP.  I saw the patient last on 18 Oct 2016.  She had a compliance of 47%, 4 hours and 12 minutes at the time with a residual AHI of 0.7 at 12 cmH2O.  She developed more more issues with compliance after 2016 and until then was a very compliant CPAP user.  There were a lot of social stressors in her life that influenced her sleep pattern and her ability to go to sleep. In 2018 I had ordered a change to auto CPAP at 7 through 15 cm water with 3 cm EPR, and she did not get supplies since, nor the new machine.     Observations/Objective: Her baseline polysomnography turned into a split-night on 11 February 2011, she had an AHI of 37.6, REM sleep AHI 66.7, she was titrated to 12 cmH2O CPAP.  I saw the patient last on 18 Oct 2016.  She had a compliance of 47%, 4 hours and 12 minutes at the time with a residual AHI of 0.7 at 12 cmH2O.  She developed more more issues with compliance after 2016 and until then was a very compliant CPAP user.  There were a lot of social stressors in her life that influenced her sleep pattern and her ability to go to sleep. In 2018 I had ordered a change to auto CPAP at 7 through 15 cm water with 3 cm EPR, and she did not get supplies since, nor the new machine.   The patient lives in a household of  three- her and 2 daughters, [id number] and [id number].  She waits until 10.30 PM to go to bed, she has 2 bathroom breaks, she repositions herself frequently due to back pain.  She sleeps in all positions, on many pillows, almost sitting in bed. She uses CPAP.    Her mouth is dry and she has water at the bed site.  She rises at 8 AM from home- now during the [place] pandemic-  with an alarm, her body feels not fully restored and not refreshed.  She craves 8 hours of sleep, but gets 6.5, wiihout CPAP she has severe headache and dizziness. She doesn't like to sleep supine- her preferred sleep position is sitting or with a pillow under her knees, due to back pain .   Social history single mother of 2,  non smoker, non ETOH, caffeine coffee in AM, sodas may be weekly.   Review of Systems: Out of a complete 14 system review, the patient complains of only the following symptoms, and all other reviewed systems are negative. morning headaches, coughing, nasal drip, soft palate closing down when sleeping in reclined position, choking.  Cystic breast disease, she stopped caffeine and chocolate.  Snoring, worried, anxious. " I cannot feel my feet, I cannot move, have chest pain".   Epworth score 12 points , Fatigue severity score  47 points , depression score 4 .   How likely are you to doze in the following situations: 0 = not likely, 1 = slight chance, 2 = moderate chance, 3 = high chance  Sitting and Reading? Watching Television? Sitting inactive in a public place (theater or meeting)? Lying down in the afternoon when circumstances permit? Sitting and talking to someone? Sitting quietly after lunch without alcohol? In a car, while stopped for a few minutes in traffic? As a passenger in a car for an hour without a break?  Total = 12/ 24   everywhere in pain,    Social History   Socioeconomic History  . Marital status: Single    Spouse name: Not on file  . Number of children: 2  . Years of education: Not on file  . Highest education level: Not on file  Occupational History  . Occupation: Tour managerinterpreter    Employer: [employer]  Social Needs  . Financial resource strain: Not on file  . Food insecurity:    Worry: Not on file    Inability: Not on file  . Transportation needs:    Medical: Not on file    Non-medical: Not on file  Tobacco Use  . Smoking status: Never Smoker  . Smokeless tobacco: Never Used  Substance and Sexual Activity  . Alcohol use: No    Alcohol/week: 0.0 standard drinks  . Drug use: No  . Sexual activity: Never    Birth control/protection: None  Lifestyle  . Physical activity:    Days per week: Not on file    Minutes per session: Not on  file  . Stress: Not on file  Relationships  . Social connections:    Talks on phone: Not on file    Gets together: Not on file    Attends religious service: Not on file    Active member of club or organization: Not on file    Attends meetings of clubs or organizations: Not on file    Relationship status: Not on file  . Intimate partner violence:    Fear of current or ex partner: Not on file    Emotionally  abused: Not on file    Physically abused: Not on file    Forced sexual activity: Not on file  Other Topics Concern  . Not on file  Social History Narrative   Divorced; 2 children.  Right handed.  Caffeine not daily.    Family History  Problem Relation Age of Onset  . Diabetes Mother   . Migraines Mother   . Hypertension Father   . Kidney Stones Sister   . Migraines Sister   . Gallbladder disease Sister   . Pancreatitis Brother   . Diabetes Maternal Grandmother   . Cancer Maternal Grandmother   . Cancer Maternal Grandfather   . Cancer Paternal Grandmother   . Cancer Paternal Grandfather     Past Medical History:  Diagnosis Date  . Allergy   . Anxiety   . Depression   . Hypertension   . IBS (irritable bowel syndrome)   . OSA (obstructive sleep apnea)     No past surgical history on file.  Current Outpatient Medications  Medication Sig Dispense Refill  . amLODipine (NORVASC) 10 MG tablet Take 10 mg by mouth daily.     No current facility-administered medications for this visit.     Allergies as of 10/22/2018 - Review Complete 10/18/2018  Allergen Reaction Noted  . Trazodone and nefazodone Other (See Comments) 12/04/2012    Vitals: LMP 09/24/2018  Last Weight:  Wt Readings from Last 1 Encounters:  10/08/18 235 lb (106.6 kg)       Last Height:   Ht Readings from Last 1 Encounters:  10/08/18 5' 9.5" (1.765 m)    Physical exam:  General: The patient is awake, alert and appears not in acute distress. The patient is well groomed. Head: Normocephalic,  atraumatic. Neck is supple. Mallampati 1   neck circumference: 15 inches . Nasal airflow nasal restricted. Retrognathia is not seen.  Cardiovascular: without distended neck veins. Respiratory: she can hold her breath for over 30 seconds.   Skin:  Without evidence of facial edema, or rash Trunk: BMI is further elevated from 30 to 32.25 , the  patient has normal posture.  Neurologic exam : The patient is awake and alert, oriented to place and time.   Memory subjective described as intact.  There is a normal attention span & concentration ability.  Speech is fluent without  dysarthria,  aphasia. Mood and affect are appropriate.  Cranial nerves: no los of smell or taste.  Pupils are equal.  Extraocular movements  in vertical and horizontal planes intact and without nystagmus. Visual fields by finger perimetry are intact. Hearing to finger rub intact.  Facial sensation intact to fine touch. Facial motor strength is symmetric and tongue and uvula move midline.   Assessment and Plan: Longstanding hypersomnia, obesity, OSA- and insomnia.   New diagnosed with HTN, on one medication( amlodipine/ Norvasc).  She still has a CPAP from 2012- due to compliance issues she was not able to get new mask, filter , etc.  I need re -qualify her for OSA therapy by a HST or attended PSG  Follow Up Instructions:  My goal is to reestablish the diagnosis of OSA and provide a new machine with autotitration capability.    I discussed the assessment and treatment plan with the patient. The patient was provided an opportunity to ask questions and all were answered. The patient agreed with the plan and demonstrated an understanding of the instructions.   The patient was advised to call back or seek an in-person  evaluation if the symptoms worsen or if the condition fails to improve as anticipated.  I provided 30 minutes of non-face-to-face time during this encounter.   Melvyn Novas, MD   Porfirio Mylar Singleton Hickox MD   10/22/2018

## 2018-11-06 ENCOUNTER — Ambulatory Visit (INDEPENDENT_AMBULATORY_CARE_PROVIDER_SITE_OTHER): Payer: BC Managed Care – PPO | Admitting: Neurology

## 2018-11-06 DIAGNOSIS — I10 Essential (primary) hypertension: Secondary | ICD-10-CM

## 2018-11-06 DIAGNOSIS — R072 Precordial pain: Secondary | ICD-10-CM

## 2018-11-06 DIAGNOSIS — G4710 Hypersomnia, unspecified: Secondary | ICD-10-CM

## 2018-11-06 DIAGNOSIS — G4733 Obstructive sleep apnea (adult) (pediatric): Secondary | ICD-10-CM

## 2018-11-06 DIAGNOSIS — G4700 Insomnia, unspecified: Secondary | ICD-10-CM

## 2018-11-06 DIAGNOSIS — G4730 Sleep apnea, unspecified: Secondary | ICD-10-CM

## 2018-11-12 NOTE — Addendum Note (Signed)
Addended by: Larey Seat on: 11/12/2018 05:46 PM   Modules accepted: Orders

## 2018-11-12 NOTE — Procedures (Signed)
PATIENT'S NAME:  Judith Francis -Natal, Rayah DOB:      12/01/1975      MR#:    [id number]     DATE OF RECORDING: 11/06/2018 REFERRING M.D.:  Aliene Beamsachel Hagler, MD Study Performed:   Baseline Polysomnogram HISTORY:  Judith Francis is a meanwhile 43 year old female patient and non-smoker who was last followed in this office on 28 November 2016 by nurse practitioner Everlene OtherMegan Milliken.  The patient had been diagnosed with obstructive sleep apnea, her risk factors were assessed, but she struggled and continued to gain weight, she became noncompliant with CPAP and presented at her last visit with a chief complaint of insomnia. Most recently she had a phone conversation with her primary care office, "Dr. Darl PikesSusan" on 21 August 2018.  She stated that she had been coughing and has yellow and clear phlegm.  She took Mucinex but also NyQuil and DayQuil.   The patient was given a Z pack on 08-21-2018. At the time she was concerned that she has not received CPAP Supplies (?  As stated, the patient has not had a repeat sleep study and based on low compliance would not have coverage for supplies, given the previously sporadic CPAP use.    Her baseline polysomnography turned into a split-night on 11 February 2011, she had an AHI of 37.6, REM sleep AHI 66.7, she was titrated to 12 cmH2O CPAP.  I saw the patient last on 18 Oct 2016.  She had a compliance of 47%, 4 hours and 12 minutes at the time with a residual AHI of 0.7 at 12 cmH2O.  She developed more issues with compliance after 2016 and until then was a very compliant CPAP user.  There were a lot of social stressors in her life that influenced her sleep pattern and her ability to go to sleep. In 2018 I had ordered a change to auto CPAP at 7 through 15 cm water with 3 cm EPR, and she did not get supplies since, nor the new machine.    The patient endorsed the Epworth Sleepiness Scale at 12/24 points.   The patient's weight 235 pounds with a height of 69.5 (inches), resulting  in a BMI of 34.9 kg/m2. The patient's neck circumference measured 15 inches.  CURRENT MEDICATIONS: Norvasc   PROCEDURE:  This is a multichannel digital polysomnogram utilizing the Somnostar 11.2 system.  Electrodes and sensors were applied and monitored per AASM Specifications.   EEG, EOG, Chin and Limb EMG, were sampled at 200 Hz.  ECG, Snore and Nasal Pressure, Thermal Airflow, Respiratory Effort, CPAP Flow and Pressure, Oximetry was sampled at 50 Hz. Digital video and audio were recorded.      BASELINE STUDY: Lights Out was at 21:04 and Lights On at 05:07.  Total recording time (TRT) was 483.5 minutes, with a total sleep time (TST) of 396 minutes.   The patient's sleep latency was 38 minutes.  REM latency was 223.5 minutes.  The sleep efficiency was 81.9 %.     SLEEP ARCHITECTURE: WASO (Wake after sleep onset) was 69 minutes.  There were 7 minutes in Stage N1, 307.5 minutes Stage N2, 42.5 minutes Stage N3 and 39 minutes in Stage REM.  The percentage of Stage N1 was 1.8%, Stage N2 was 77.7%, Stage N3 was 10.7% and Stage R (REM sleep) was 9.8%.      RESPIRATORY ANALYSIS:  There were a total of 66 respiratory events:  1 obstructive apneas, 1 central apneas and 0 mixed apneas with a total  of 2 apneas and an apnea index (AI) of .3 /hour. There were 64 hypopneas with a hypopnea index of 9.7 /hour. The patient also had 0 respiratory event related arousals (RERAs).     The total APNEA/HYPOPNEA INDEX (AHI) was 10.0 /hour and the total RESPIRATORY DISTURBANCE INDEX was 10.0 /hour.  27 events occurred in REM sleep and 75 events in NREM. The REM AHI was 41.5 /hour, versus a non-REM AHI of 6.6/h. The patient spent 177.5 minutes of total sleep time in the supine position and 219 minutes in non-supine. The supine AHI was 16.2/h versus a non-supine AHI of 5.0.  OXYGEN SATURATION & C02:  The Wake baseline 02 saturation was 95%, with the lowest being 83%. Time spent below 89% saturation equaled 16 minutes. The  arousals were noted as: 60 were spontaneous, 0 were associated with PLMs, and 22 were associated with respiratory events. The patient had a total of 0 Periodic Limb Movements.    Audio and video analysis did not show any abnormal or unusual movements, behaviors, phonations or vocalizations.  Mild Snoring was noted. EKG was in keeping with normal sinus rhythm (NSR).  IMPRESSION:  1. There is still mild Obstructive Sleep Apnea (OSA) at an AHI of 10/h and REM accentuated to AHI of 41.5/h  RECOMMENDATIONS:  1. Advise to use autotitration CPAP 5-15 cm water 3 cm EPR.     I certify that I have reviewed the entire raw data recording prior to the issuance of this report in accordance with the Standards of Accreditation of the American Academy of Sleep Medicine (AASM)    Larey Seat, MD   11-12-2018 Diplomat, American Board of Psychiatry and Neurology  Diplomat, American Board of Cataract Director, [facility] [employer]

## 2018-11-15 ENCOUNTER — Telehealth: Payer: Self-pay | Admitting: Neurology

## 2018-11-15 NOTE — Telephone Encounter (Signed)
-----   Message from Larey Seat, MD sent at 11/12/2018  5:46 PM EDT ----- 1. There is still mild Obstructive Sleep Apnea (OSA) at an AHI of  10/h and REM accentuated to AHI of 41.5/h  RECOMMENDATIONS:  1. Advise to use autotitration CPAP 5-15 cm water 3 cm EPR.    I certify that I have reviewed the entire raw data recording  prior to the issuance of this report in accordance with the  Standards of Accreditation of the [place] [employer] of Sleep  Medicine (AASM)    Larey Seat, MD  11-12-2018

## 2018-11-15 NOTE — Telephone Encounter (Signed)
I called Judith Francis. I advised Judith Francis that Dr. Brett Fairy reviewed their sleep study results and found that Judith Francis has mild sleep apnea. Dr. Brett Fairy recommends that Judith Francis starts auto CPAP 5-15cm water pressure. I reviewed PAP compliance expectations with the Judith Francis. Judith Francis is agreeable to starting a CPAP. I advised Judith Francis that an order will be sent to a DME, [facility], and [facility] will call the Judith Francis within about one week after they file with the Judith Francis's insurance. [facility] will show the Judith Francis how to use the machine, fit for masks, and troubleshoot the CPAP if needed. A follow up appt was made for insurance purposes with Dr. Brett Fairy on Aug 18,2020 at 10:30 am. Judith Francis verbalized understanding to arrive 15 minutes early and bring their CPAP. A letter with all of this information in it will be mailed to the Judith Francis as a reminder. I verified with the Judith Francis that the address we have on file is correct. Judith Francis verbalized understanding of results. Judith Francis had no questions at this time but was encouraged to call back if questions arise. I have sent the order to [facility] and have received confirmation that they have received the order.

## 2019-01-03 ENCOUNTER — Ambulatory Visit: Payer: BC Managed Care – PPO | Admitting: Neurology

## 2019-01-22 ENCOUNTER — Encounter

## 2019-01-22 ENCOUNTER — Other Ambulatory Visit: Payer: Self-pay

## 2019-01-22 ENCOUNTER — Ambulatory Visit: Payer: BC Managed Care – PPO | Admitting: Neurology

## 2019-01-22 ENCOUNTER — Encounter: Payer: Self-pay | Admitting: Neurology

## 2019-01-22 VITALS — BP 115/76 | HR 56 | Temp 97.7°F | Ht 69.5 in | Wt 236.0 lb

## 2019-01-22 DIAGNOSIS — I10 Essential (primary) hypertension: Secondary | ICD-10-CM | POA: Diagnosis not present

## 2019-01-22 DIAGNOSIS — E6609 Other obesity due to excess calories: Secondary | ICD-10-CM | POA: Diagnosis not present

## 2019-01-22 DIAGNOSIS — G4733 Obstructive sleep apnea (adult) (pediatric): Secondary | ICD-10-CM

## 2019-01-22 DIAGNOSIS — R072 Precordial pain: Secondary | ICD-10-CM | POA: Diagnosis not present

## 2019-01-22 DIAGNOSIS — Z6835 Body mass index (BMI) 35.0-35.9, adult: Secondary | ICD-10-CM

## 2019-01-22 DIAGNOSIS — Z9989 Dependence on other enabling machines and devices: Secondary | ICD-10-CM

## 2019-01-22 NOTE — Patient Instructions (Signed)

## 2019-01-22 NOTE — Progress Notes (Addendum)
S[facility]   Provider:  Melvyn Novasarmen  Jylan Loeza, M D  Referring Provider: No ref. provider found Primary Care Physician:  Hoyt KochYousef, Deema, MD (Inactive) Mrs. Kirtland BouchardHernandez - Francis was seen on 9--12-2010 Chief Complaint  Patient presents with  . Follow-up    pt alone, rm 10. pt states things are well except she has reached out twice to DME to get supplies and she still has not received. DME AHD    HPI:  Hyman BowerMaria Hernandez Francis is a 43 y.o. hispanic and right handed  female and seen here as a revisit on 01-22-2019.  The patient has been an established CPAP user since 2012, and she was in June reevaluated for continuous need of CPAP.  Her sleep study was a baseline polysomnogram from 06 November 2018 and confirmed an AHI of 10/h a REM dependent AHI of 41.5/h and supine AHI was 16.2/h.  This constellation usually needs CPAP to be corrected.  She did not have PLM's but she had various and frequent spontaneous arousals.  We ordered an auto titration CPAP device with a pressure window between 5 and 15 cm of water pressure and 3 cm expiratory pressure relief are able today to review both the sleep study and the compliance data which has been have been excellent.  The machine was evaluated here with the last of 13-day data obtained from the 11th-12 of August 2020.  Average user per day is 5 hours 10 minutes.  The residual AHI is 1.3 which is a good resolution the 95th percentile air leak is 12.9 L/min, 95th percentile pressure is 10.6 cm.  I do not need to make adjustments to the pressure.  ROS : We also obtained an Epworth score to see if the patient reads is still sleepy which he endorsed at 7 out of 24 points of fatigue score was endorsed at 33 out of 63 points.  Both are excellent reviews. Her headaches resolved, her sleep is longer and more restorative. She feels very hot at night, and uses a fan and a setting of 65 degrees.   Her eldest daughter lost her health insurance and is Bipolar. Her high schooler  is doing well.   She uses a Publishing copy[employer] and [employer] in small.     History of Present Illness: [name] is a meanwhile 43 year old female patient and non-smoker who was last followed in this office on 28 November 2016 by nurse practitioner Everlene OtherMegan Milliken.  The patient had been diagnosed with obstructive sleep apnea, her risk factors were assessed, but she struggled and continued to gain weight, she became noncompliant with CPAP and presented at her last visit with a chief complaint of insomnia. Most recently she had a phone conversation with her primary care office, Dr. Darl PikesSusan on 21 August 2018.  She stated that she had been coughing and has yellow and clear phlegm.  She took Mucinex but also NyQuil and DayQuil.  The patient was given a Z pack on 08-21-2018.   At the time she was concerned that she has not received CPAP Supplies (? ) As stated, the patient has not had a repeat sleep study and based on low compliance would not have coverage for supplies, given the previously sporadic CPAP use.   Her baseline polysomnography turned into a split-night on 11 February 2011, she had an AHI of 37.6, REM sleep AHI 66.7, she was titrated to 12 cmH2O CPAP.  I saw the patient last on 18 Oct 2016.  She had a  compliance of 47%, 4 hours and 12 minutes at the time with a residual AHI of 0.7 at 12 cmH2O.  She developed more more issues with compliance after 2016 and until then was a very compliant CPAP user.  There were a lot of social stressors in her life that influenced her sleep pattern and her ability to go to sleep. In 2018 I had ordered a change to auto CPAP at 7 through 15 cm water with 3 cm EPR, and she did not get supplies since, nor the new machine.     Observations/Objective: Her baseline polysomnography turned into a split-night on 11 February 2011, she had an AHI of 37.6, REM sleep AHI 66.7, she was titrated to 12 cmH2O CPAP.  I saw the patient last on 18 Oct 2016.  She had a compliance of  47%, 4 hours and 12 minutes at the time with a residual AHI of 0.7 at 12 cmH2O.  She developed more more issues with compliance after 2016 and until then was a very compliant CPAP user.  There were a lot of social stressors in her life that influenced her sleep pattern and her ability to go to sleep. In 2018 I had ordered a change to auto CPAP at 7 through 15 cm water with 3 cm EPR, and she did not get supplies since, nor the new machine.   The patient lives in a household of three- her and 2 daughters, [id number] and [id number].  She waits until 10.30 PM to go to bed, she has 2 bathroom breaks, she repositions herself frequently due to back pain.  She sleeps in all positions, on many pillows, almost sitting in bed. She uses CPAP.    Her mouth is dry and she has water at the bed site.  She rises at 8 AM from home- now during the [place] pandemic-  with an alarm, her body feels not fully restored and not refreshed.  She craves 8 hours of sleep, but gets 6.5, wiihout CPAP she has severe headache and dizziness. She doesn't like to sleep supine- her preferred sleep position is sitting or with a pillow under her knees, due to back pain .   Social history single mother of 2, non smoker, non ETOH, caffeine coffee in AM, sodas may be weekly.   Review of Systems: Out of a complete 14 system review, the patient complains of only the following symptoms, and all other reviewed systems are negative. morning headaches, coughing, nasal drip, soft palate closing down when sleeping in reclined position, choking.  Cystic breast disease, she stopped caffeine and chocolate.  Snoring, worried, anxious. " I cannot feel my feet, I cannot move, have chest pain".  Sweating frequently   Epworth score 12 points , Fatigue severity score  33 from 47 points , depression score 4 .   How likely are you to doze in the following situations: 0 = not likely, 1 = slight chance, 2 = moderate chance, 3 = high chance  Sitting and Reading? Watching  Television? Sitting inactive in a public place (theater or meeting)? Lying down in the afternoon when circumstances permit? Sitting and talking to someone? Sitting quietly after lunch without alcohol? In a car, while stopped for a few minutes in traffic? As a passenger in a car for an hour without a break?  Total = 7 from 12/ 24      Social History   Socioeconomic History  . Marital status: Single    Spouse name: Not on file  .  Number of children: 2  . Years of education: Not on file  . Highest education level: Not on file  Occupational History  . Occupation: Tour managerinterpreter    Employer: [employer]  Social Needs  . Financial resource strain: Not on file  . Food insecurity    Worry: Not on file    Inability: Not on file  . Transportation needs    Medical: Not on file    Non-medical: Not on file  Tobacco Use  . Smoking status: Never Smoker  . Smokeless tobacco: Never Used  Substance and Sexual Activity  . Alcohol use: No    Alcohol/week: 0.0 standard drinks  . Drug use: No  . Sexual activity: Never    Birth control/protection: None  Lifestyle  . Physical activity    Days per week: Not on file    Minutes per session: Not on file  . Stress: Not on file  Relationships  . Social Musicianconnections    Talks on phone: Not on file    Gets together: Not on file    Attends religious service: Not on file    Active member of club or organization: Not on file    Attends meetings of clubs or organizations: Not on file    Relationship status: Not on file  . Intimate partner violence    Fear of current or ex partner: Not on file    Emotionally abused: Not on file    Physically abused: Not on file    Forced sexual activity: Not on file  Other Topics Concern  . Not on file  Social History Narrative   Divorced; 2 children.  Right handed.  Caffeine not daily.    Family History  Problem Relation Age of Onset  . Diabetes Mother   . Migraines Mother   . Hypertension  Father   . Kidney Stones Sister   . Migraines Sister   . Gallbladder disease Sister   . Pancreatitis Brother   . Diabetes Maternal Grandmother   . Cancer Maternal Grandmother   . Cancer Maternal Grandfather   . Cancer Paternal Grandmother   . Cancer Paternal Grandfather     Past Medical History:  Diagnosis Date  . Allergy   . Anxiety   . Depression   . Hypertension   . IBS (irritable bowel syndrome)   . OSA (obstructive sleep apnea)     No past surgical history on file.  Current Outpatient Medications  Medication Sig Dispense Refill  . amLODipine (NORVASC) 10 MG tablet Take 10 mg by mouth daily.    Marland Kitchen. triamterene-hydrochlorothiazide (MAXZIDE-25) 37.5-25 MG tablet TK 1 T PO QAM     No current facility-administered medications for this visit.     Allergies as of 01/22/2019 - Review Complete 01/22/2019  Allergen Reaction Noted  . Trazodone and nefazodone Other (See Comments) 12/04/2012    Vitals: BP 115/76   Pulse (!) 56   Temp 97.7 F (36.5 C)   Ht 5' 9.5" (1.765 m)   Wt 236 lb (107 kg)   BMI 34.35 kg/m  Last Weight:  Wt Readings from Last 1 Encounters:  01/22/19 236 lb (107 kg)       Last Height:   Ht Readings from Last 1 Encounters:  01/22/19 5' 9.5" (1.765 m)    Physical exam:  General: The patient is awake, alert and appears not in acute distress. The patient is well groomed. Head: Normocephalic, atraumatic. Neck is supple. Mallampati 1   neck circumference: 15  inches . Nasal airflow nasal restricted. Retrognathia is not seen.  Cardiovascular: without distended neck veins. Respiratory: she can hold her breath for over 30 seconds.   Skin:  Without evidence of facial edema, or rash Trunk: BMI is further elevated from 30 to 34 , the  patient has normal posture.  Neurologic exam : The patient is awake and alert, oriented to place and time.   Memory subjective described as intact.  There is a normal attention span & concentration ability.  Speech is  fluent without  dysarthria,  aphasia.  Mood and affect are appropriate.  Cranial nerves: no los of smell or taste.  Pupils are equal.  Extraocular movements  in vertical and horizontal planes intact and without nystagmus. Visual fields by finger perimetry are intact. Hearing to finger rub intact. Facial sensation intact to fine touch. Facial motor strength is symmetric and tongue and uvula move midline.   Assessment and Plan:  OSA well controlled a on CPAP, autotitration device issued in June 2020.  Needs supplies SIMPLUS Mask FFM, small cushion.    Follow Up Instructions:  RV with Np in  12 month.   PS : Patient has mentioned symptoms of meralgia paresthetic, and this is likely related to obesity.  She has seen a chiropractitioner for neck and back pain.     I discussed the assessment and treatment plan with the patient. The patient was provided an opportunity to ask questions and all were answered. The patient agreed with the plan and demonstrated an understanding of the instructions.   The patient was advised to call back or seek an in-person evaluation if the symptoms worsen or if the condition fails to improve as anticipated.  I provided 25 minutes of non-face-to-face time during this encounter.   Larey Seat, MD   Asencion Partridge Noemi Ishmael MD  01/22/2019

## 2019-06-24 ENCOUNTER — Ambulatory Visit (HOSPITAL_COMMUNITY)
Admission: EM | Admit: 2019-06-24 | Discharge: 2019-06-24 | Disposition: A | Discharge status: Home / Self Care | Payer: BC Managed Care – PPO | Attending: Family Medicine | Admitting: Family Medicine

## 2019-06-24 ENCOUNTER — Encounter (HOSPITAL_COMMUNITY): Payer: Self-pay | Admitting: Emergency Medicine

## 2019-06-24 ENCOUNTER — Other Ambulatory Visit: Payer: Self-pay

## 2019-06-24 DIAGNOSIS — M792 Neuralgia and neuritis, unspecified: Secondary | ICD-10-CM | POA: Diagnosis not present

## 2019-06-24 DIAGNOSIS — B029 Zoster without complications: Secondary | ICD-10-CM | POA: Diagnosis not present

## 2019-06-24 MED ORDER — GABAPENTIN 100 MG PO CAPS: 100.0000 mg | ORAL_CAPSULE | Freq: Three times a day (TID) | ORAL | 0 refills | Status: AC

## 2019-06-24 MED ORDER — VALACYCLOVIR HCL 1 G PO TABS: 1000.0000 mg | ORAL_TABLET | Freq: Three times a day (TID) | ORAL | 0 refills | Status: AC

## 2019-06-24 MED ORDER — HYDROCODONE-ACETAMINOPHEN 5-325 MG PO TABS: 1.0000 | ORAL_TABLET | ORAL | 0 refills | Status: AC | PRN

## 2019-06-24 NOTE — ED Triage Notes (Signed)
Pt reports left sided jaw pain that she states she can feel in her left lower teeth.  She states it radiates into her left ear and temple and down into her neck.  She states it feels like nerve pain.  She states about every 20 min she will get an intense pain that takes her breath away and she states cant move her neck when it feels like that.  She states the intense pain lasts about 10 minutes each time.  Pt has tried taking Aleve, Naprosen, Tylenol, and Ibuprofen, with no relief.

## 2019-06-24 NOTE — ED Provider Notes (Signed)
[facility]    CSN: [id number] Arrival date & time: 06/24/19  [place]      History   Chief Complaint Chief Complaint  Patient presents with  . Facial Pain    left    HPI Judith Francis is a 44 y.o. female.   History of Present Illness  Patient Identification Judith Francis is a 44 y.o. female who presents with complaints of left lower facial pain. Onset of symptoms was 3 days ago. Patient describes pain as burning and sharp/stabbing. Pain severity at worst was 10 /10. The pain radiates into the entire left facial region. Patient denies jaw swelling or fever >101. Pain is aggravated by nothing in particular. Pain is alleviated by nothing. The patient denies other complaints. Patient has not sought treatment by another care provider for this problem. Care prior to arrival consisted of NSAID and acetaminophen, with no relief.         Past Medical History:  Diagnosis Date  . Allergy   . Anxiety   . Depression   . Hypertension   . IBS (irritable bowel syndrome)   . OSA (obstructive sleep apnea)     Patient Active Problem List   Diagnosis Date Noted  . Insomnia 10/22/2018  . Hypersomnia with sleep apnea 10/22/2018  . Fatigue due to depression 10/18/2016  . Essential hypertension 02/07/2013  . Chest pain 02/07/2013  . OSA on CPAP 12/04/2012  . Obese 10/22/2012    History reviewed. No pertinent surgical history.  OB History   No obstetric history on file.      Home Medications    Prior to Admission medications   Medication Sig Start Date End Date Taking? Authorizing Provider  amLODipine (NORVASC) 10 MG tablet Take 10 mg by mouth daily. 10/16/18  Yes [provider]  triamterene-hydrochlorothiazide (MAXZIDE-25) 37.5-25 MG tablet TK 1 T PO QAM 01/15/19  Yes [provider]  gabapentin (NEURONTIN) 100 MG capsule Take 1 capsule (100 mg total) by mouth 3 (three) times daily for 7 days. 06/24/19 07/01/19  Enrique Sack, FNP    HYDROcodone-acetaminophen (NORCO/VICODIN) 5-325 MG tablet Take 1 tablet by mouth every 4 (four) hours as needed for severe pain. 06/24/19   Enrique Sack, FNP  valACYclovir (VALTREX) 1000 MG tablet Take 1 tablet (1,000 mg total) by mouth 3 (three) times daily for 7 days. 06/24/19 07/01/19  Enrique Sack, FNP    Family History Family History  Problem Relation Age of Onset  . Diabetes Mother   . Migraines Mother   . Hypertension Father   . Kidney Stones Sister   . Migraines Sister   . Gallbladder disease Sister   . Pancreatitis Brother   . Diabetes Maternal Grandmother   . Cancer Maternal Grandmother   . Cancer Maternal Grandfather   . Cancer Paternal Grandmother   . Cancer Paternal Grandfather     Social History Social History   Tobacco Use  . Smoking status: Never Smoker  . Smokeless tobacco: Never Used  Substance Use Topics  . Alcohol use: No    Alcohol/week: 0.0 standard drinks  . Drug use: No     Allergies   Trazodone and nefazodone   Review of Systems Review of Systems  Constitutional: Negative for fever.  HENT: Negative for facial swelling.   Respiratory: Negative.   Cardiovascular: Negative.   Gastrointestinal: Negative.   Musculoskeletal: Negative.   Skin: Negative for rash.  Neurological: Negative.   All other systems reviewed and are negative.  Physical Exam Triage Vital Signs ED Triage Vitals  Enc Vitals Group     BP 06/24/19 1915 125/76     Pulse Rate 06/24/19 1915 71     Resp 06/24/19 1915 14     Temp 06/24/19 1915 98.6 F (37 C)     Temp Source 06/24/19 1915 Oral     SpO2 06/24/19 1915 100 %     Weight --      Height --      Head Circumference --      Peak Flow --      Pain Score 06/24/19 1913 8     Pain Loc --      Pain Edu? --      Excl. in GC? --    No data found.  Updated Vital Signs BP 125/76 (BP Location: Right Arm)   Pulse 71   Temp 98.6 F (37 C) (Oral)   Resp 14   LMP 06/03/2019 (Approximate)   SpO2 100%    Visual Acuity Right Eye Distance:   Left Eye Distance:   Bilateral Distance:    Right Eye Near:   Left Eye Near:    Bilateral Near:     Physical Exam Constitutional:      Appearance: Normal appearance.  HENT:     Head: Normocephalic.      Nose: Nose normal.     Mouth/Throat:     Lips: Pink.     Mouth: Mucous membranes are moist.     Dentition: Normal dentition. No dental tenderness, gingival swelling, dental caries, dental abscesses or gum lesions.     Tongue: No lesions.     Pharynx: Oropharynx is clear. Uvula midline.     Tonsils: No tonsillar exudate.  Cardiovascular:     Rate and Rhythm: Normal rate and regular rhythm.  Pulmonary:     Effort: Pulmonary effort is normal.  Musculoskeletal:        General: Normal range of motion.     Cervical back: Normal range of motion and neck supple. No tenderness.  Skin:    General: Skin is warm and dry.  Neurological:     General: No focal deficit present.     Mental Status: She is alert and oriented to person, place, and time.  Psychiatric:        Mood and Affect: Mood normal.      UC Treatments / Results  Labs (all labs ordered are listed, but only abnormal results are displayed) Labs Reviewed - No data to display  EKG   Radiology No results found.  Procedures Procedures (including critical care time)  Medications Ordered in UC Medications - No data to display  Initial Impression / Assessment and Plan / UC Course  I have reviewed the triage vital signs and the nursing notes.  Pertinent labs & imaging results that were available during my care of the patient were reviewed by me and considered in my medical decision making (see chart for details).    44 yo female presenting with a three-day history of left facial pain. No rash, fevers or swelling. No dental pain. History of chicken pox and shingles in the past. I suspect herpes zoster with acute neuritis. Treating with valacyclovir, gabapentin and norco.    Today's evaluation has revealed no signs of a dangerous process. Discussed diagnosis with patient and/or guardian. Patient and/or guardian aware of their diagnosis, possible red flag symptoms to watch out for and need for close follow up. Patient and/or guardian understands  verbal and written discharge instructions. Patient and/or guardian comfortable with plan and disposition.  Patient and/or guardian has a clear mental status at this time, good insight into illness (after discussion and teaching) and has clear judgment to make decisions regarding their care  This care was provided during an unprecedented National Emergency due to the Novel Coronavirus (COVID-19) pandemic. COVID-19 infections and transmission risks place heavy strains on healthcare resources.  As this pandemic evolves, our facility, providers, and staff strive to respond fluidly, to remain operational, and to provide care relative to available resources and information. Outcomes are unpredictable and treatments are without well-defined guidelines. Further, the impact of COVID-19 on all aspects of urgent care, including the impact to patients seeking care for reasons other than COVID-19, is unavoidable during this national emergency. At this time of the global pandemic, management of patients has significantly changed, even for non-COVID positive patients given high local and regional COVID volumes at this time requiring high healthcare system and resource utilization. The standard of care for management of both COVID suspected and non-COVID suspected patients continues to change rapidly at the local, regional, national, and global levels. This patient was worked up and treated to the best available but ever changing evidence and resources available at this current time.   Documentation was completed with the aid of voice recognition software. Transcription may contain typographical errors.  Final Clinical Impressions(s) / UC Diagnoses    Final diagnoses:  Herpes zoster without complication  Acute neuritis   Discharge Instructions   None    ED Prescriptions    Medication Sig Dispense Auth. Provider   valACYclovir (VALTREX) 1000 MG tablet Take 1 tablet (1,000 mg total) by mouth 3 (three) times daily for 7 days. 21 tablet Lurline Idol, FNP   gabapentin (NEURONTIN) 100 MG capsule Take 1 capsule (100 mg total) by mouth 3 (three) times daily for 7 days. 21 capsule Lurline Idol, FNP   HYDROcodone-acetaminophen (NORCO/VICODIN) 5-325 MG tablet Take 1 tablet by mouth every 4 (four) hours as needed for severe pain. 12 tablet Lurline Idol, FNP     I have reviewed the PDMP during this encounter.   Lurline Idol, [place] 06/24/19 2012

## 2019-10-17 ENCOUNTER — Other Ambulatory Visit: Payer: Self-pay | Admitting: Family Medicine

## 2019-10-17 DIAGNOSIS — Z1231 Encounter for screening mammogram for malignant neoplasm of breast: Secondary | ICD-10-CM

## 2019-10-21 ENCOUNTER — Other Ambulatory Visit: Payer: Self-pay | Admitting: Family Medicine

## 2019-10-21 DIAGNOSIS — R1012 Left upper quadrant pain: Secondary | ICD-10-CM

## 2019-10-24 ENCOUNTER — Other Ambulatory Visit: Payer: Self-pay | Admitting: Nurse Practitioner

## 2019-10-24 DIAGNOSIS — N926 Irregular menstruation, unspecified: Secondary | ICD-10-CM

## 2019-10-24 DIAGNOSIS — R102 Pelvic and perineal pain unspecified side: Secondary | ICD-10-CM

## 2019-11-07 ENCOUNTER — Ambulatory Visit
Admission: RE | Admit: 2019-11-07 | Discharge: 2019-11-07 | Disposition: A | Discharge status: Home / Self Care | Payer: BC Managed Care – PPO | Source: Ambulatory Visit | Attending: Nurse Practitioner | Admitting: Nurse Practitioner

## 2019-11-07 DIAGNOSIS — R102 Pelvic and perineal pain: Secondary | ICD-10-CM

## 2019-11-07 DIAGNOSIS — N926 Irregular menstruation, unspecified: Secondary | ICD-10-CM

## 2019-11-13 ENCOUNTER — Ambulatory Visit
Admission: RE | Admit: 2019-11-13 | Discharge: 2019-11-13 | Disposition: A | Discharge status: Home / Self Care | Payer: BC Managed Care – PPO | Source: Ambulatory Visit | Attending: Family Medicine | Admitting: Family Medicine

## 2019-11-13 ENCOUNTER — Other Ambulatory Visit: Payer: Self-pay

## 2019-11-13 DIAGNOSIS — Z1231 Encounter for screening mammogram for malignant neoplasm of breast: Secondary | ICD-10-CM

## 2019-11-13 DIAGNOSIS — R1012 Left upper quadrant pain: Secondary | ICD-10-CM

## 2020-01-06 ENCOUNTER — Ambulatory Visit: Payer: BC Managed Care – PPO | Admitting: Adult Health

## 2020-12-28 IMAGING — CT CT HEAD WITHOUT CONTRAST
3 series · 16 of 47 positions shown, 19 images · non-contrast
Comparison: None.

CLINICAL DATA: Headache.  Hypertension.

EXAM:
CT HEAD WITHOUT CONTRAST
TECHNIQUE: Contiguous axial images were obtained from the base of the skull
through the vertex without intravenous contrast.

[Series 2: head wo · axial · 0.45mm/px · z∈[-183,-48]mm · 10 of 33 slices shown, 13 images]
[im 3/33  brain]
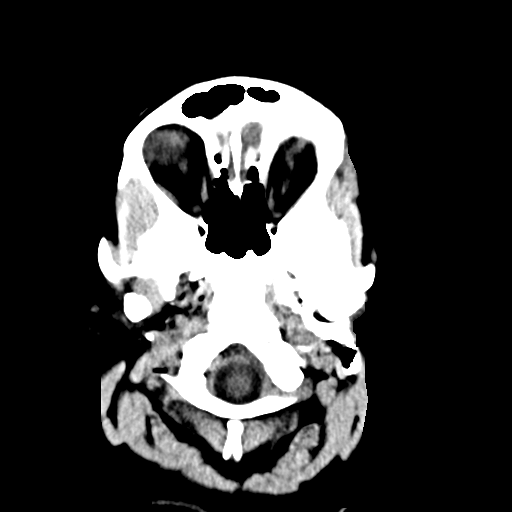
[im 3/33  bone]
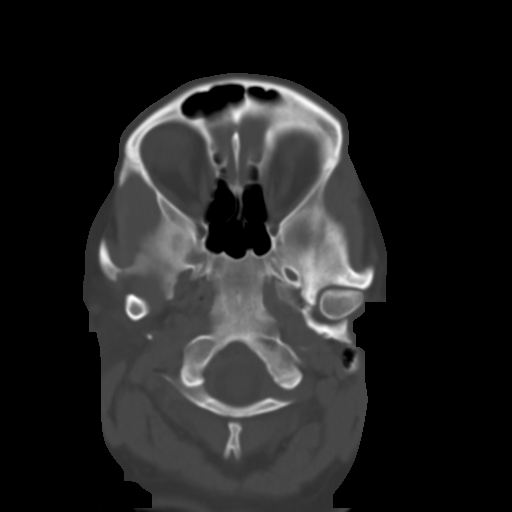
[im 6/33  brain]
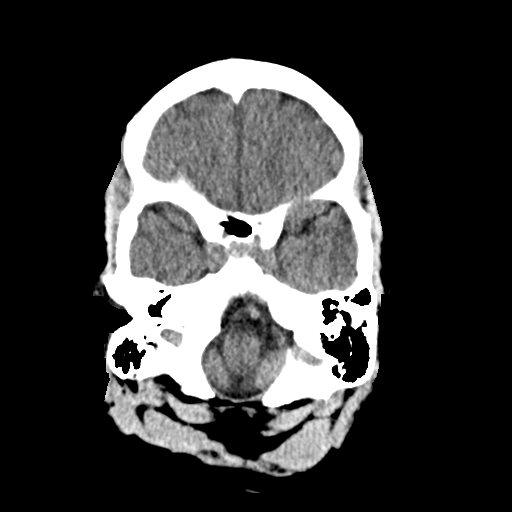
[im 9/33  brain]
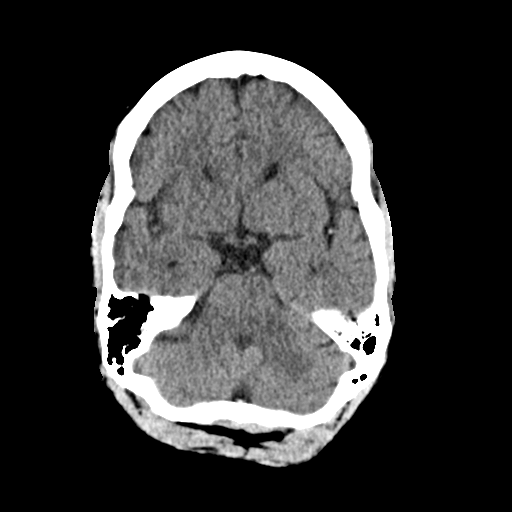
[im 12/33  brain]
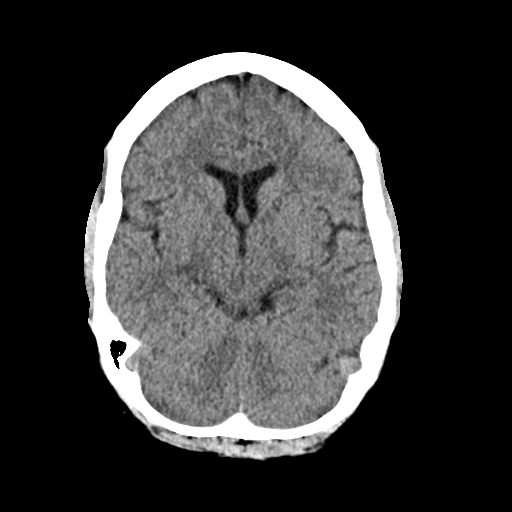
[im 15/33  brain]
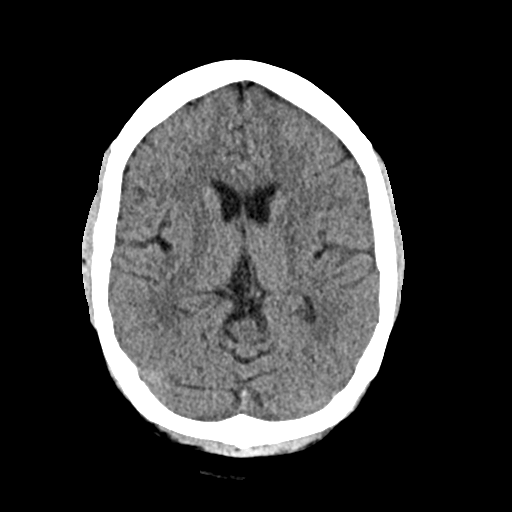
[im 15/33  bone]
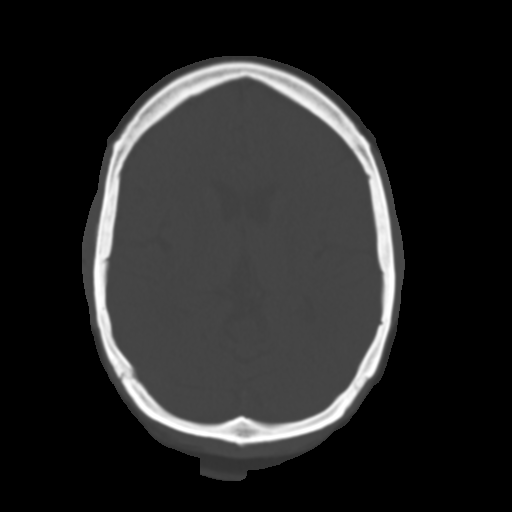
[im 18/33  brain]
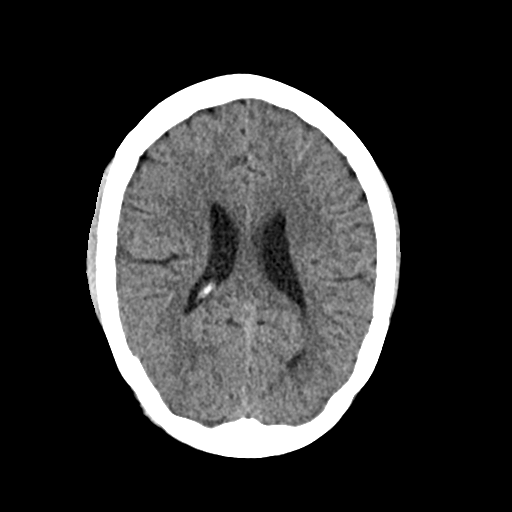
[im 21/33  brain]
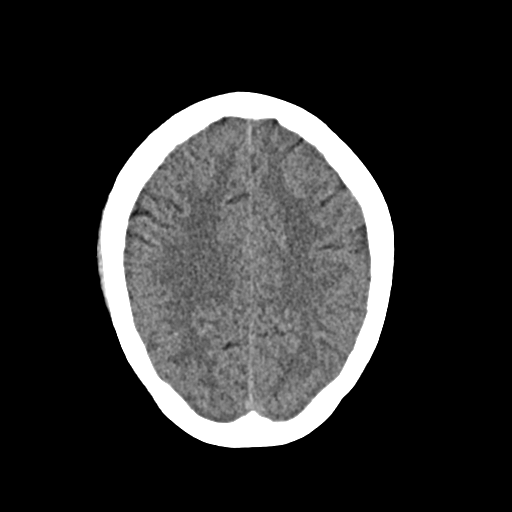
[im 25/33  brain]
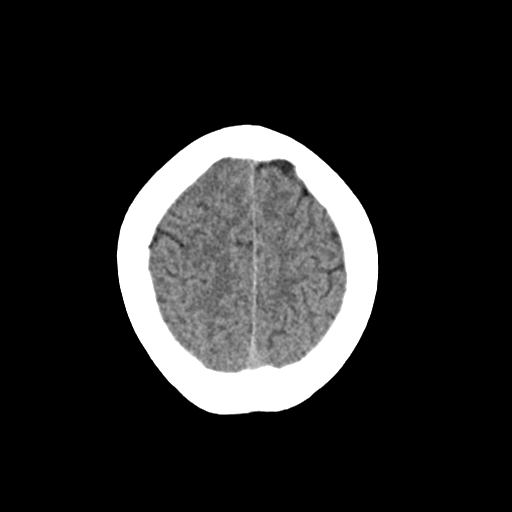
[im 27/33  brain]
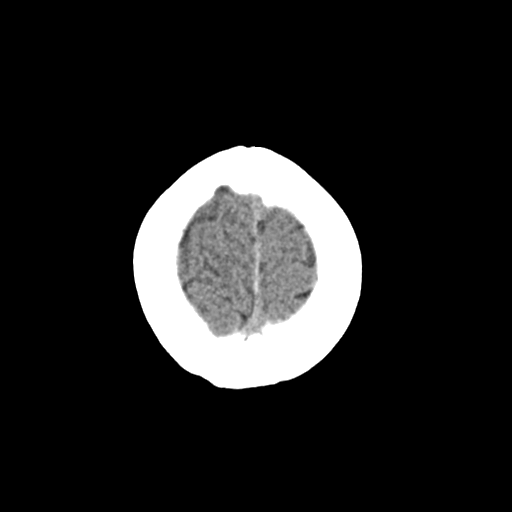
[im 27/33  bone]
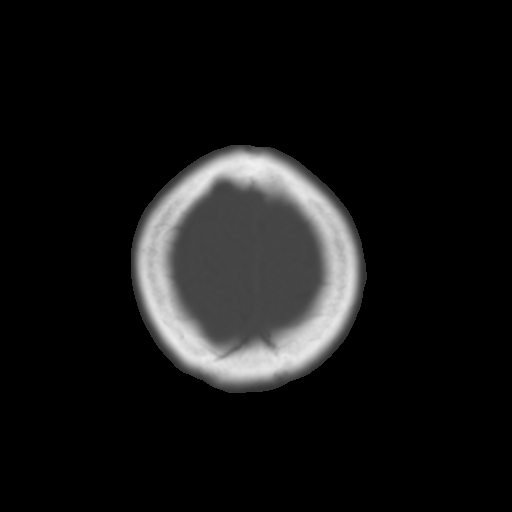
[im 30/33  brain]
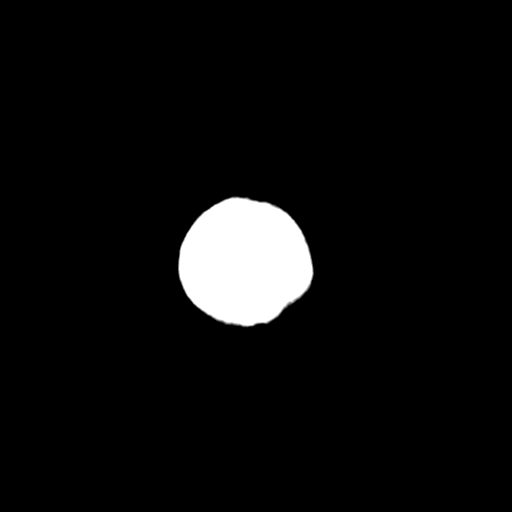

[Series 4: coronal soft · coronal · 0.32mm/px · 3 of 67 slices shown]
[im 23/67  brain]
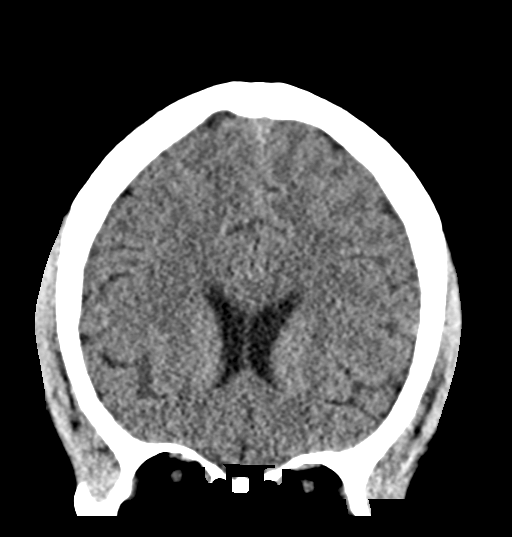
[im 30/67  brain]
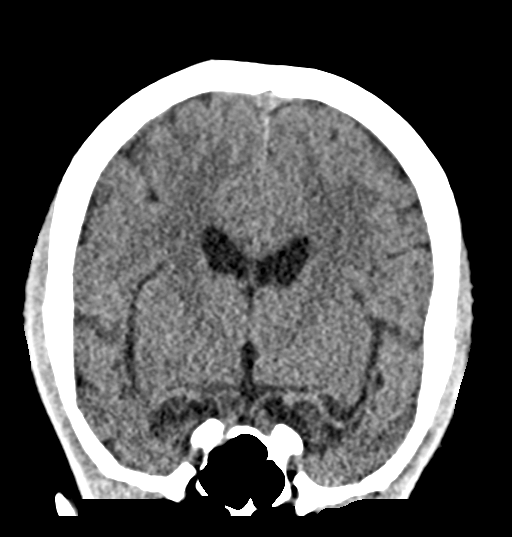
[im 37/67  brain]
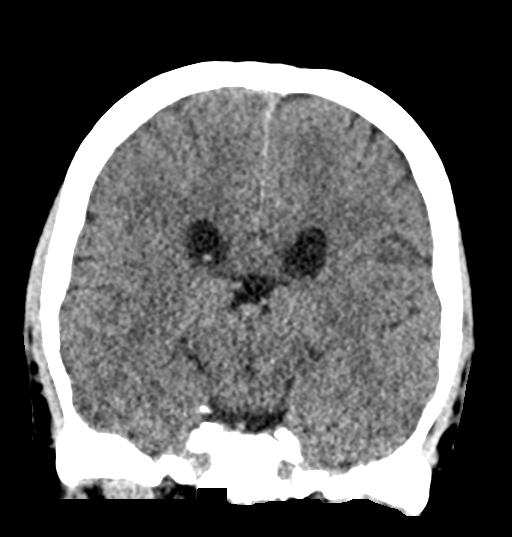

[Series 5: sag soft · sagittal · 0.33mm/px · 3 of 53 slices shown]
[im 18/53  brain]
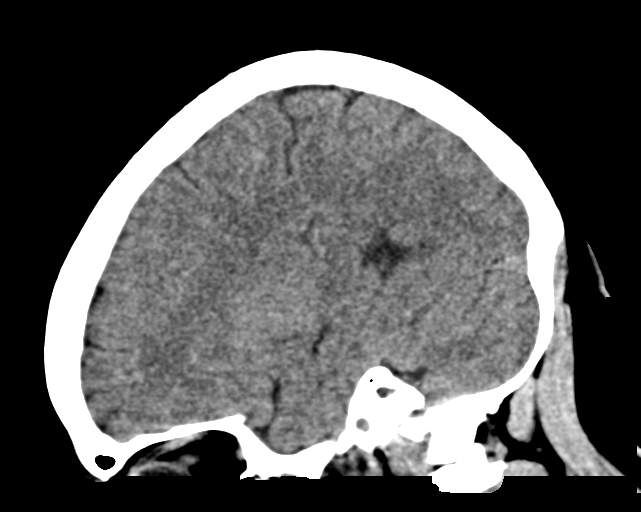
[im 27/53  brain]
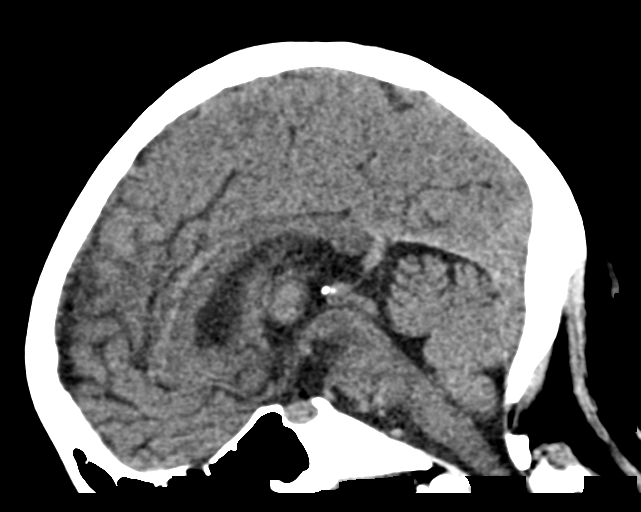
[im 35/53  brain]
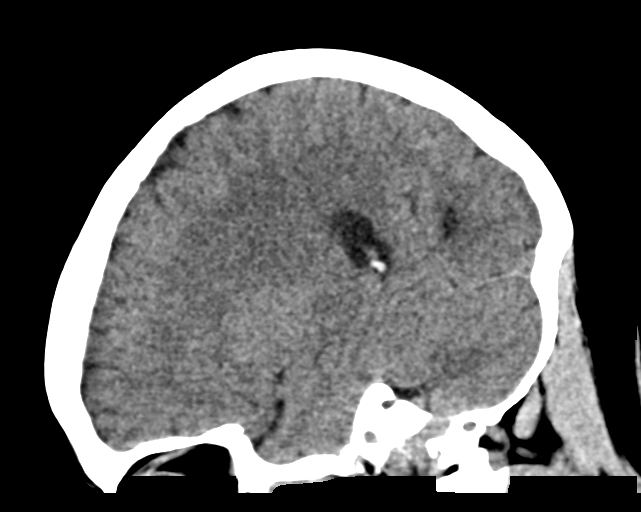

[16 of 47 positions shown; findings below may reference images not displayed]

FINDINGS: Brain: No evidence of acute infarction, hemorrhage, hydrocephalus,
extra-axial collection or mass lesion/mass effect.

Vascular: No hyperdense vessel or unexpected calcification.

Skull: Normal.

Sinuses/Orbits: Normal.

Other: None
IMPRESSION: Normal exam.

## 2021-04-18 ENCOUNTER — Ambulatory Visit (HOSPITAL_COMMUNITY)
Admission: EM | Admit: 2021-04-18 | Discharge: 2021-04-18 | Disposition: A | Discharge status: Home / Self Care | Payer: Worker's Compensation | Attending: Emergency Medicine | Admitting: Emergency Medicine

## 2021-04-18 ENCOUNTER — Encounter (HOSPITAL_COMMUNITY): Payer: Self-pay | Admitting: Emergency Medicine

## 2021-04-18 ENCOUNTER — Other Ambulatory Visit: Payer: Self-pay

## 2021-04-18 DIAGNOSIS — S61011A Laceration without foreign body of right thumb without damage to nail, initial encounter: Secondary | ICD-10-CM

## 2021-04-18 MED ORDER — DOXYCYCLINE HYCLATE 100 MG PO CAPS: 100.0000 mg | ORAL_CAPSULE | Freq: Two times a day (BID) | ORAL | 0 refills | Status: AC

## 2021-04-18 MED ORDER — IBUPROFEN 800 MG PO TABS: 800.0000 mg | ORAL_TABLET | Freq: Three times a day (TID) | ORAL | 0 refills | Status: AC

## 2021-04-18 NOTE — Discharge Instructions (Signed)
Take doxycycline twice a day for the next 7 days, this medication will help to clear infection  Can take ibuprofen every 8 hours as needed for comfort  May return to urgent care at any point if you feel that symptoms are worsening and area is not healing appropriately  You may cleanse area as needed with mild soap and warm water, and cover with a Band-Aid

## 2021-04-18 NOTE — ED Provider Notes (Signed)
[facility]    CSN: [id number] Arrival date & time: 04/18/21  1518      History   Chief Complaint Chief Complaint  Patient presents with   Wound Check    HPI Judith Francis is a 45 y.o. female.   Patient presents with laceration to the palmar aspect of the right thumb occurring 6 days ago after metal machinery hit finger.  Was assessed by employee health and wellness.  No treatment given.  Endorses that finger was healing well with no complication until 1 day ago.  Has begun to have pain throughout finger and has noticed some mild redness.  Endorses that wound bed has become yellow.  Denies drainage, fever, chills.  History of hypertension.  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 No pertinent surgical history.  OB History   No obstetric history on file.      Home Medications    Prior to Admission medications   Medication Sig Start Date End Date Taking? Authorizing Provider  doxycycline (VIBRAMYCIN) 100 MG capsule Take 1 capsule (100 mg total) by mouth 2 (two) times daily. 04/18/21  Yes Avon Molock R, NP  ibuprofen (ADVIL) 800 MG tablet Take 1 tablet (800 mg total) by mouth 3 (three) times daily. 04/18/21  Yes Jacobo Moncrief R, NP  amLODipine (NORVASC) 10 MG tablet Take 10 mg by mouth daily. 10/16/18   [provider]  gabapentin (NEURONTIN) 100 MG capsule Take 1 capsule (100 mg total) by mouth 3 (three) times daily for 7 days. 06/24/19 07/01/19  Lurline Idol, FNP  HYDROcodone-acetaminophen (NORCO/VICODIN) 5-325 MG tablet  Take 1 tablet by mouth every 4 (four) hours as needed for severe pain. 06/24/19   Lurline Idol, FNP  triamterene-hydrochlorothiazide (MAXZIDE-25) 37.5-25 MG tablet TK 1 T PO QAM 01/15/19   [provider]    Family History Family History  Problem Relation Age of Onset   Diabetes Mother    Migraines Mother    Hypertension Father    Kidney Stones Sister    Migraines Sister    Gallbladder disease Sister    Pancreatitis Brother    Diabetes Maternal Grandmother    Cancer Maternal Grandmother    Cancer Maternal Grandfather    Cancer Paternal Grandmother    Cancer Paternal Grandfather     Social History Social History   Tobacco Use   Smoking status: Never   Smokeless tobacco: Never  Substance Use Topics   Alcohol use: No    Alcohol/week: 0.0 standard drinks   Drug use: No     Allergies   Trazodone and nefazodone   Review of Systems Review of Systems  Constitutional: Negative.   Respiratory: Negative.    Cardiovascular: Negative.   Gastrointestinal: Negative.   Skin:  Positive for wound. Negative for color change, pallor and rash.    Physical Exam Triage Vital Signs ED Triage Vitals  Enc Vitals Group     BP 04/18/21 1614 107/68     Pulse Rate 04/18/21  1614 67     Resp 04/18/21 1614 17     Temp 04/18/21 1614 98.6 F (37 C)     Temp Source 04/18/21 1614 Oral     SpO2 04/18/21 1614 97 %     Weight --      Height --      Head Circumference --      Peak Flow --      Pain Score 04/18/21 1612 5     Pain Loc --      Pain Edu? --      Excl. in GC? --    No data found.  Updated Vital Signs BP 107/68 (BP Location: Left Arm)   Pulse 67   Temp 98.6 F (37 C) (Oral)   Resp 17   LMP 04/04/2021   SpO2 97%   Visual Acuity Right Eye Distance:   Left Eye Distance:   Bilateral Distance:    Right Eye Near:   Left Eye Near:    Bilateral Near:     Physical Exam Constitutional:      Appearance: Normal appearance. She is normal weight.  Eyes:      Extraocular Movements: Extraocular movements intact.  Pulmonary:     Effort: Pulmonary effort is normal.  Skin:    Comments: Defer to photo  Neurological:     Mental Status: She is alert and oriented to person, place, and time. Mental status is at baseline.  Psychiatric:        Mood and Affect: Mood normal.        Behavior: Behavior normal.      UC Treatments / Results  Labs (all labs ordered are listed, but only abnormal results are displayed) Labs Reviewed - No data to display  EKG   Radiology No results found.  Procedures Procedures (including critical care time)  Medications Ordered in UC Medications - No data to display  Initial Impression / Assessment and Plan / UC Course  I have reviewed the triage vital signs and the nursing notes.  Pertinent labs & imaging results that were available during my care of the patient were reviewed by me and considered in my medical decision making (see chart for details).  Laceration of right thumb without foreign body without damage to nail, initial encounter  Wound bed appears to be healing appropriately, yellow eschar present but no drainage, due to new tenderness we \\cover  for infection, discussed with patient, doxycycline 100 mg twice daily for 7 days, ibuprofen 800 mg 3 times daily prescribed, patient to follow-up as needed at urgent car, may cleanse with mild water as needed and cover with Band-Aid as needed  Final diagnoses:  Laceration of right thumb without foreign body without damage to nail, initial encounter     Discharge Instructions      Take doxycycline twice a day for the next 7 days, this medication will help to clear infection  Can take ibuprofen every 8 hours as needed for comfort  May return to urgent care at any point if you feel that symptoms are worsening and area is not healing appropriately  You may cleanse area as needed with mild soap and warm water, and cover with a Band-Aid   ED Prescriptions      Medication Sig Dispense Auth. Provider   doxycycline (VIBRAMYCIN) 100 MG capsule Take 1 capsule (100 mg total) by mouth 2 (two) times daily. 14 capsule Roniqua Kintz R, NP   ibuprofen (ADVIL) 800 MG tablet Take 1 tablet (800  mg total) by mouth 3 (three) times daily. 21 tablet Andres Escandon, Elita Boone, NP      PDMP not reviewed this encounter.   Valinda Hoar, NP 04/18/21 [phone]

## 2021-04-18 NOTE — ED Triage Notes (Signed)
Pt reports on 11/8 cut finger at work. Was seen at [facility]. Reports supposed to be going back to work tomorrow but finger now having more pains than it did and where skin is cut off on right thumb now has yellowish color. Denies drainage.

## 2021-06-10 ENCOUNTER — Other Ambulatory Visit: Payer: Self-pay | Admitting: Family Medicine

## 2021-06-28 ENCOUNTER — Other Ambulatory Visit: Payer: Self-pay | Admitting: Family Medicine

## 2021-06-28 DIAGNOSIS — Z1231 Encounter for screening mammogram for malignant neoplasm of breast: Secondary | ICD-10-CM

## 2021-07-20 ENCOUNTER — Ambulatory Visit
Admission: RE | Admit: 2021-07-20 | Discharge: 2021-07-20 | Disposition: A | Discharge status: Home / Self Care | Payer: BC Managed Care – PPO | Source: Ambulatory Visit | Attending: Family Medicine | Admitting: Family Medicine

## 2021-07-20 DIAGNOSIS — Z1231 Encounter for screening mammogram for malignant neoplasm of breast: Secondary | ICD-10-CM

## 2021-07-21 ENCOUNTER — Other Ambulatory Visit: Payer: Self-pay | Admitting: Physician Assistant

## 2021-07-21 DIAGNOSIS — R1012 Left upper quadrant pain: Secondary | ICD-10-CM

## 2021-08-23 ENCOUNTER — Other Ambulatory Visit: Payer: Self-pay | Admitting: Physician Assistant

## 2021-08-23 DIAGNOSIS — R1012 Left upper quadrant pain: Secondary | ICD-10-CM

## 2021-08-26 ENCOUNTER — Ambulatory Visit
Admission: RE | Admit: 2021-08-26 | Discharge: 2021-08-26 | Disposition: A | Discharge status: Home / Self Care | Payer: BC Managed Care – PPO | Source: Ambulatory Visit | Attending: Physician Assistant | Admitting: Physician Assistant

## 2021-08-26 DIAGNOSIS — R1012 Left upper quadrant pain: Secondary | ICD-10-CM

## 2021-08-26 MED ORDER — IOPAMIDOL (ISOVUE-300) INJECTION 61%
100.0000 mL | Freq: Once | INTRAVENOUS | Status: AC | PRN
  Administered 2021-08-26: 100 mL via INTRAVENOUS

## 2021-11-24 ENCOUNTER — Emergency Department (HOSPITAL_COMMUNITY): Payer: BC Managed Care – PPO

## 2021-11-24 ENCOUNTER — Emergency Department (HOSPITAL_COMMUNITY)
Admission: EM | Admit: 2021-11-24 | Discharge: 2021-11-24 | Disposition: A | Discharge status: Home / Self Care | Payer: BC Managed Care – PPO | Attending: Emergency Medicine | Admitting: Emergency Medicine

## 2021-11-24 ENCOUNTER — Encounter (HOSPITAL_COMMUNITY): Payer: Self-pay

## 2021-11-24 ENCOUNTER — Other Ambulatory Visit: Payer: Self-pay

## 2021-11-24 DIAGNOSIS — I10 Essential (primary) hypertension: Secondary | ICD-10-CM | POA: Diagnosis not present

## 2021-11-24 DIAGNOSIS — Z79899 Other long term (current) drug therapy: Secondary | ICD-10-CM | POA: Insufficient documentation

## 2021-11-24 DIAGNOSIS — H538 Other visual disturbances: Secondary | ICD-10-CM | POA: Diagnosis present

## 2021-11-24 DIAGNOSIS — R519 Headache, unspecified: Secondary | ICD-10-CM | POA: Insufficient documentation

## 2021-11-24 LAB — I-STAT BETA HCG BLOOD, ED (MC, WL, AP ONLY): I-stat hCG, quantitative: <5 m[IU]/mL (ref <5)

## 2021-11-24 LAB — CBC
HCT: 42.7 % (ref 36.0–46.0)
Hemoglobin: 14.3 g/dL (ref 12.0–15.0)
MCH: 31.5 pg (ref 26.0–34.0)
MCHC: 33.5 g/dL (ref 30.0–36.0)
MCV: 94.1 fL (ref 80.0–100.0)
Platelets: 266 10*3/uL (ref 150–400)
RBC: 4.54 MIL/uL (ref 3.87–5.11)
RDW: 12.7 % (ref 11.5–15.5)
WBC: 7.4 10*3/uL (ref 4.0–10.5)
nRBC: 0 % (ref 0.0–0.2)

## 2021-11-24 LAB — COMPREHENSIVE METABOLIC PANEL
ALT: 47 U/L — ABNORMAL HIGH (ref 0–44)
AST: 30 U/L (ref 15–41)
Albumin: 3.9 g/dL (ref 3.5–5.0)
Alkaline Phosphatase: 76 U/L (ref 38–126)
Anion gap: 10 (ref 5–15)
BUN: 11 mg/dL (ref 6–20)
CO2: 25 mmol/L (ref 22–32)
Calcium: 9.3 mg/dL (ref 8.9–10.3)
Chloride: 103 mmol/L (ref 98–111)
Creatinine, Ser: 0.81 mg/dL (ref 0.44–1.00)
GFR, Estimated: >60 mL/min (ref >60)
Glucose, Bld: 94 mg/dL (ref 70–99)
Potassium: 3.5 mmol/L (ref 3.5–5.1)
Sodium: 138 mmol/L (ref 135–145)
Total Bilirubin: 0.8 mg/dL (ref 0.3–1.2)
Total Protein: 7.1 g/dL (ref 6.5–8.1)

## 2021-11-24 MED ORDER — KETOROLAC TROMETHAMINE 15 MG/ML IJ SOLN
15.0000 mg | Freq: Once | INTRAMUSCULAR | Status: AC
  Administered 2021-11-24: 15 mg via INTRAVENOUS
  Filled 2021-11-24: qty 1

## 2021-11-24 MED ORDER — DEXAMETHASONE SODIUM PHOSPHATE 10 MG/ML IJ SOLN
10.0000 mg | Freq: Once | INTRAMUSCULAR | Status: AC
  Administered 2021-11-24: 10 mg via INTRAVENOUS
  Filled 2021-11-24: qty 1

## 2021-11-24 MED ORDER — IOHEXOL 350 MG/ML SOLN
75.0000 mL | Freq: Once | INTRAVENOUS | Status: AC | PRN
  Administered 2021-11-24: 75 mL via INTRAVENOUS

## 2021-11-24 MED ORDER — LACTATED RINGERS IV BOLUS
1000.0000 mL | Freq: Once | INTRAVENOUS | Status: AC
  Administered 2021-11-24: 1000 mL via INTRAVENOUS

## 2021-11-24 MED ORDER — METOCLOPRAMIDE HCL 5 MG/ML IJ SOLN
10.0000 mg | Freq: Once | INTRAMUSCULAR | Status: AC
  Administered 2021-11-24: 10 mg via INTRAVENOUS
  Filled 2021-11-24: qty 2

## 2021-11-24 NOTE — ED Notes (Signed)
Pt returned from MRI °

## 2021-11-24 NOTE — ED Provider Notes (Incomplete)
[facility] EMERGENCY DEPARTMENT Provider Note   CSN: [id number] Arrival date & time: 11/24/21  1246     History Chief Complaint  Patient presents with   Blurred Vision    Judith Francis is a 46 y.o. female with h/o HTN presents to the ED for evaluation of visual changes and headache.   HPI     Home Medications Prior to Admission medications   Medication Sig Start Date End Date Taking? Authorizing Provider  amLODipine (NORVASC) 10 MG tablet Take 10 mg by mouth daily. 10/16/18   [provider]  doxycycline (VIBRAMYCIN) 100 MG capsule Take 1 capsule (100 mg total) by mouth 2 (two) times daily. 04/18/21   White, Elita Boone, NP  gabapentin (NEURONTIN) 100 MG capsule Take 1 capsule (100 mg total) by mouth 3 (three) times daily for 7 days. 06/24/19 07/01/19  Lurline Idol, FNP  HYDROcodone-acetaminophen (NORCO/VICODIN) 5-325 MG tablet Take 1 tablet by mouth every 4 (four) hours as needed for severe pain. 06/24/19   Lurline Idol, FNP  ibuprofen (ADVIL) 800 MG tablet Take 1 tablet (800 mg total) by mouth 3 (three) times daily. 04/18/21   Valinda Hoar, NP  triamterene-hydrochlorothiazide (MAXZIDE-25) 37.5-25 MG tablet TK 1 T PO QAM 01/15/19   [provider]      Allergies    Trazodone and nefazodone    Review of Systems   Review of Systems  Physical Exam Updated Vital Signs BP 110/65 (BP Location: Left Arm)   Pulse 65   Temp 98 F (36.7 C) (Oral)   Resp 16   Ht 5\' 10"  (1.778 m)   Wt 117.9 kg   SpO2 97%   BMI 37.31 kg/m  Physical Exam  ED Results / Procedures / Treatments   Labs (all labs ordered are listed, but only abnormal results are displayed) Labs Reviewed  COMPREHENSIVE METABOLIC PANEL - Abnormal; Notable for the following components:      Result Value   ALT 47 (*)    All other components within normal limits  CBC  I-STAT BETA HCG BLOOD, ED ([facility], [facility], [facility] ONLY)    EKG None  Radiology MR BRAIN WO  CONTRAST  Result Date: 11/24/2021 CLINICAL DATA:  TIA, headache with neck pain and vision loss in both eyes some neck pain EXAM: MRI HEAD WITHOUT CONTRAST TECHNIQUE: Multiplanar, multiecho pulse sequences of the brain and surrounding structures were obtained without intravenous contrast. COMPARISON:  No prior MRI, correlation is made with CTA head and neck 11/24/2021 FINDINGS: Brain: No restricted diffusion to suggest acute or subacute infarct. No acute hemorrhage, mass, mass effect, or midline shift. No hydrocephalus or extra-axial collection. No hemosiderin deposition to suggest remote hemorrhage. Scattered T2 hyperintense signal in the periventricular white matter, likely the sequela of early chronic small vessel ischemic disease. Vascular: Normal arterial flow voids. Skull and upper cervical spine: Normal marrow signal. Sinuses/Orbits: Mucosal thickening in the maxillary sinuses. No acute finding in the orbits. Other: The mastoids are well aerated. IMPRESSION: No acute intracranial process. No etiology is seen for the patient's symptoms. Electronically Signed   By: 11/26/2021 M.D.   On: 11/24/2021 21:05   CT ANGIO HEAD NECK W WO CM  Result Date: 11/24/2021 CLINICAL DATA:  Headache with neck pain and vision loss in both eyes briefly with improvement but blurry vision in the left that resolved subsequently. Some neck pain. EXAM: CT ANGIOGRAPHY HEAD AND NECK TECHNIQUE: Multidetector CT imaging of the head and neck was performed  using the standard protocol during bolus administration of intravenous contrast. Multiplanar CT image reconstructions and MIPs were obtained to evaluate the vascular anatomy. Carotid stenosis measurements (when applicable) are obtained utilizing NASCET criteria, using the distal internal carotid diameter as the denominator. RADIATION DOSE REDUCTION: This exam was performed according to the departmental dose-optimization program which includes automated exposure control, adjustment  of the mA and/or kV according to patient size and/or use of iterative reconstruction technique. CONTRAST:  59mL OMNIPAQUE IOHEXOL 350 MG/ML SOLN COMPARISON:  Head CT 10/08/2018 FINDINGS: CT HEAD FINDINGS Brain: No evidence of acute infarction, hemorrhage, hydrocephalus, extra-axial collection or mass lesion/mass effect. Vascular: See below Skull: Normal. Negative for fracture or focal lesion. Sinuses: Imaged portions are clear. Orbits: Negative Review of the MIP images confirms the above findings CTA NECK FINDINGS Aortic arch: 3 vessel branching, although joint origins of the common carotid and brachiocephalic and left vertebral origin from the arch. Right carotid system: Smooth and widely patent. Left carotid system: Smooth and widely patent. Vertebral arteries: The non dominant left vertebral artery arises from the arch with blurring from motion artifact at its origin. Vertebral arteries are smooth and widely patent. No proximal subclavian stenosis Skeleton: C6-7 disc degeneration. Focal left C2-3 facet osteoarthritis. Other neck: No incidental mass or inflammation noted. Upper chest: Clear apical lungs Review of the MIP images confirms the above findings CTA HEAD FINDINGS Anterior circulation: No branch occlusion, beading, stenosis, or aneurysm. No noted atheromatous irregularity Posterior circulation: The vertebral arteries are smoothly contoured and widely patent. Widely patent basilar artery. Hypoplastic right P1 segment with fetal type right PCA flow. No stenosis, branch occlusion, or aneurysm Venous sinuses: Diffusely patent Anatomic variants: As above Review of the MIP images confirms the above findings IMPRESSION: Negative CTA of the head and neck. Electronically Signed   By: Tiburcio Pea M.D.   On: 11/24/2021 18:38    Procedures Procedures  {Document cardiac monitor, telemetry assessment procedure when appropriate:1}  Medications Ordered in ED Medications  iohexol (OMNIPAQUE) 350 MG/ML  injection 75 mL (75 mLs Intravenous Contrast Given 11/24/21 1814)  ketorolac (TORADOL) 15 MG/ML injection 15 mg (15 mg Intravenous Given 11/24/21 1946)  metoCLOPramide (REGLAN) injection 10 mg (10 mg Intravenous Given 11/24/21 1946)  dexamethasone (DECADRON) injection 10 mg (10 mg Intravenous Given 11/24/21 1945)  lactated ringers bolus 1,000 mL (1,000 mLs Intravenous New Bag/Given 11/24/21 1944)    ED Course/ Medical Decision Making/ A&P                           Medical Decision Making Amount and/or Complexity of Data Reviewed Radiology: ordered.  Risk Prescription drug management.   ***  {Document critical care time when appropriate:1} {Document review of labs and clinical decision tools ie heart score, Chads2Vasc2 etc:1}  {Document your independent review of radiology images, and any outside records:1} {Document your discussion with family members, caretakers, and with consultants:1} {Document social determinants of health affecting pt's care:1} {Document your decision making why or why not admission, treatments were needed:1} Final Clinical Impression(s) / ED Diagnoses Final diagnoses:  None    Rx / DC Orders ED Discharge Orders     None

## 2021-11-24 NOTE — ED Provider Triage Note (Signed)
Emergency Medicine Provider Triage Evaluation Note  Judith Francis , a 46 y.o. female  was evaluated in triage.  Pt complains of an episode of blurred vision that lasted for 10-15 min she states this effected both eyes but her right eye improved but half of the vision in her left eye improved slower and she states the outter half of the left eye vision was the last thing to improve.   Headache currently but developed after the vision improved.   Denies NV, no arm or leg weakness.   Review of Systems  Positive: Headache, vision change Negative: Fever   Physical Exam  BP 136/84   Pulse 74   Temp 98.3 F (36.8 C) (Oral)   Resp 18   Ht 5\' 10"  (1.778 m)   Wt 117.9 kg   SpO2 98%   BMI 37.31 kg/m  Gen:   Awake, no distress   Resp:  Normal effort  MSK:   Moves extremities without difficulty  Other:    Alert and oriented to self, place, time and event.   Speech is fluent, clear without dysarthria or dysphasia.   Strength 5/5 in upper/lower extremities   Sensation intact in upper/lower extremities   Normal gait.  Negative Romberg. No pronator drift.  Normal finger-to-nose and feet tapping.  CN I not tested  CN II grossly intact visual fields bilaterally. Did not visualize posterior eye.  CN III, IV, VI PERRLA and EOMs intact bilaterally  CN V Intact sensation to sharp and light touch to the face  CN VII facial movements symmetric  CN VIII not tested  CN IX, X no uvula deviation, symmetric rise of soft palate  CN XI 5/5 SCM and trapezius strength bilaterally  CN XII Midline tongue protrusion, symmetric L/R movements    Medical Decision Making  Medically screening exam initiated at 1:19 PM.  Appropriate orders placed.  was informed that the remainder of the evaluation will be completed by another provider, this initial triage assessment does not replace that evaluation, and the importance of remaining in the ED until their evaluation is  complete.  Labs, CT head   Stephan Minister, Gailen Shelter 11/24/21 1323

## 2021-11-24 NOTE — ED Triage Notes (Signed)
Pt arrived POV from work c/o blurred vision and a bright light around 1130 and was not able to see for about 10-15 mins. Pt states the vision has resolved but she has a slight headache.

## 2021-11-24 NOTE — Discharge Instructions (Addendum)
You were seen in the emergency department for evaluation of your blurred vision and headache.  Your labs and CT and MRI imaging were unremarkable.  No signs of stroke noted.  I am glad you are feeling better.  Please make sure you follow-up with a neurologist.  I included information for one in this discharge paperwork.  Please make sure you also follow-up with your primary care doctor.  If you have any concern, new or worsening symptoms, please return to the nearest emergency department for reevaluation.  Contact a health care provider if: Your symptoms do not improve or they get worse. You have: New symptoms. A headache. Trouble seeing at night. Trouble noticing the difference between colors. You notice: Drooping of your eyelids. Drainage coming from your eyes. A rash around your eyes. Get help right away if: You have: Severe eye pain. A severe headache. A sudden change in vision. A sudden loss of vision. A vision change after an injury. You notice flashing lights in your field of vision. Your field of vision is the area that you can see without moving your eyes.

## 2021-11-24 NOTE — ED Notes (Signed)
Taken to MRI 

## 2022-11-16 ENCOUNTER — Other Ambulatory Visit: Payer: Self-pay | Admitting: Family Medicine

## 2022-11-16 DIAGNOSIS — Z1231 Encounter for screening mammogram for malignant neoplasm of breast: Secondary | ICD-10-CM

## 2022-12-23 ENCOUNTER — Ambulatory Visit
Admission: RE | Admit: 2022-12-23 | Discharge: 2022-12-23 | Disposition: A | Discharge status: Home / Self Care | Payer: BC Managed Care – PPO | Source: Ambulatory Visit | Attending: Family Medicine | Admitting: Family Medicine

## 2022-12-23 DIAGNOSIS — Z1231 Encounter for screening mammogram for malignant neoplasm of breast: Secondary | ICD-10-CM

## 2024-06-12 ENCOUNTER — Other Ambulatory Visit: Payer: Self-pay | Admitting: Nurse Practitioner

## 2024-06-12 ENCOUNTER — Ambulatory Visit
Admission: RE | Admit: 2024-06-12 | Discharge: 2024-06-12 | Disposition: A | Discharge status: Home / Self Care | Source: Ambulatory Visit | Attending: Nurse Practitioner | Admitting: Nurse Practitioner

## 2024-06-12 DIAGNOSIS — R102 Pelvic and perineal pain unspecified side: Secondary | ICD-10-CM
